# Patient Record
Sex: Male | Born: 1970 | Hispanic: Yes | Marital: Single
Health system: Southern US, Community
[De-identification: ages and names within clinical notes are randomized; demographics above are authoritative.]

## PROBLEM LIST (undated history)

## (undated) ENCOUNTER — Telehealth

## (undated) ENCOUNTER — Encounter

## (undated) ENCOUNTER — Ambulatory Visit

## (undated) ENCOUNTER — Encounter: Attending: Nurse Practitioner | Primary: Nurse Practitioner

## (undated) ENCOUNTER — Telehealth: Attending: Internal Medicine | Primary: Internal Medicine

## (undated) ENCOUNTER — Telehealth: Attending: Registered" | Primary: Registered"

## (undated) ENCOUNTER — Telehealth: Attending: Nephrology | Primary: Nephrology

## (undated) ENCOUNTER — Ambulatory Visit: Attending: Surgical Critical Care | Primary: Surgical Critical Care

## (undated) ENCOUNTER — Ambulatory Visit: Attending: Pharmacist | Primary: Pharmacist

## (undated) ENCOUNTER — Telehealth
Attending: Pharmacist Clinician (PhC)/ Clinical Pharmacy Specialist | Primary: Pharmacist Clinician (PhC)/ Clinical Pharmacy Specialist

## (undated) DIAGNOSIS — K4090 Unilateral inguinal hernia, without obstruction or gangrene, not specified as recurrent: Secondary | ICD-10-CM

## (undated) DIAGNOSIS — E119 Type 2 diabetes mellitus without complications: Secondary | ICD-10-CM

## (undated) DIAGNOSIS — B20 Human immunodeficiency virus [HIV] disease: Secondary | ICD-10-CM

## (undated) DIAGNOSIS — N200 Calculus of kidney: Secondary | ICD-10-CM

## (undated) HISTORY — PX: APPENDECTOMY: SHX54

## (undated) HISTORY — PX: OTHER SURGICAL HISTORY: SHX169

## (undated) MED ORDER — SULFAMETHOXAZOLE 800 MG-TRIMETHOPRIM 160 MG TABLET: Freq: Two times a day (BID) | ORAL | 0 days

---
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
## 2003-07-30 ENCOUNTER — Other Ambulatory Visit: Payer: Self-pay

## 2007-02-16 ENCOUNTER — Emergency Department: Payer: Self-pay | Admitting: Emergency Medicine

## 2007-02-27 ENCOUNTER — Emergency Department: Payer: Self-pay | Admitting: Emergency Medicine

## 2007-03-02 ENCOUNTER — Other Ambulatory Visit: Payer: Self-pay

## 2007-03-02 ENCOUNTER — Emergency Department: Payer: Self-pay | Admitting: Emergency Medicine

## 2007-12-25 ENCOUNTER — Other Ambulatory Visit: Payer: Self-pay

## 2007-12-25 ENCOUNTER — Emergency Department: Payer: Self-pay | Admitting: Internal Medicine

## 2009-01-31 ENCOUNTER — Inpatient Hospital Stay: Payer: Self-pay | Admitting: Internal Medicine

## 2009-10-25 ENCOUNTER — Emergency Department: Payer: Self-pay | Admitting: Emergency Medicine

## 2012-04-11 ENCOUNTER — Inpatient Hospital Stay: Payer: Self-pay | Admitting: Internal Medicine

## 2012-04-11 LAB — DIFFERENTIAL
Basophil %: 0.8 %
Eosinophil %: 11 %
Lymphocyte #: 1.5 10*3/uL (ref 1.0–3.6)
Monocyte #: 0.7 x10 3/mm (ref 0.2–1.0)
Monocyte %: 7.8 %

## 2012-04-11 LAB — URINALYSIS, COMPLETE
Bilirubin,UR: NEGATIVE
Blood: NEGATIVE
Glucose,UR: NEGATIVE mg/dL (ref 0–75)
Ketone: NEGATIVE
Nitrite: NEGATIVE
Protein: NEGATIVE
Specific Gravity: 1.016 (ref 1.003–1.030)
Squamous Epithelial: NONE SEEN
WBC UR: 16 /HPF (ref 0–5)

## 2012-04-11 LAB — COMPREHENSIVE METABOLIC PANEL
Alkaline Phosphatase: 184 U/L — ABNORMAL HIGH (ref 50–136)
BUN: 15 mg/dL (ref 7–18)
Bilirubin,Total: 0.5 mg/dL (ref 0.2–1.0)
Calcium, Total: 8.4 mg/dL — ABNORMAL LOW (ref 8.5–10.1)
Chloride: 103 mmol/L (ref 98–107)
EGFR (African American): >60
EGFR (Non-African Amer.): >60
Osmolality: 272 (ref 275–301)
Potassium: 3.6 mmol/L (ref 3.5–5.1)
SGOT(AST): 18 U/L (ref 15–37)
SGPT (ALT): 26 U/L (ref 12–78)
Sodium: 134 mmol/L — ABNORMAL LOW (ref 136–145)
Total Protein: 7.7 g/dL (ref 6.4–8.2)

## 2012-04-11 LAB — RAPID INFLUENZA A&B ANTIGENS

## 2012-04-11 LAB — CK-MB: CK-MB: 1.4 ng/mL (ref 0.5–3.6)

## 2012-04-11 LAB — CBC
HGB: 15.2 g/dL (ref 13.0–18.0)
MCV: 92 fL (ref 80–100)
Platelet: 197 10*3/uL (ref 150–440)
RBC: 4.65 10*6/uL (ref 4.40–5.90)
RDW: 12.9 % (ref 11.5–14.5)

## 2012-04-11 LAB — DRUG SCREEN, URINE
Amphetamines, Ur Screen: NEGATIVE (ref ?–1000)
Benzodiazepine, Ur Scrn: NEGATIVE (ref ?–200)
Methadone, Ur Screen: NEGATIVE (ref ?–300)
Opiate, Ur Screen: NEGATIVE (ref ?–300)

## 2012-04-11 LAB — TROPONIN I
Troponin-I: <0.02 ng/mL
Troponin-I: <0.02 ng/mL

## 2012-04-11 LAB — LIPASE, BLOOD: Lipase: 143 U/L (ref 73–393)

## 2012-04-12 LAB — BASIC METABOLIC PANEL
Anion Gap: 11 (ref 7–16)
BUN: 17 mg/dL (ref 7–18)
Calcium, Total: 8.4 mg/dL — ABNORMAL LOW (ref 8.5–10.1)
Chloride: 102 mmol/L (ref 98–107)
Co2: 20 mmol/L — ABNORMAL LOW (ref 21–32)
EGFR (African American): >60
EGFR (Non-African Amer.): >60
Glucose: 204 mg/dL — ABNORMAL HIGH (ref 65–99)
Osmolality: 274 (ref 275–301)
Potassium: 4.3 mmol/L (ref 3.5–5.1)

## 2012-04-12 LAB — CBC WITH DIFFERENTIAL/PLATELET
Basophil #: 0 10*3/uL (ref 0.0–0.1)
Eosinophil #: 0 10*3/uL (ref 0.0–0.7)
HCT: 40.8 % (ref 40.0–52.0)
HGB: 14.5 g/dL (ref 13.0–18.0)
Lymphocyte #: 0.9 10*3/uL — ABNORMAL LOW (ref 1.0–3.6)
MCH: 33 pg (ref 26.0–34.0)
Monocyte %: 2.3 %
Neutrophil #: 6.7 10*3/uL — ABNORMAL HIGH (ref 1.4–6.5)
Neutrophil %: 86.2 %
Platelet: 198 10*3/uL (ref 150–440)
RDW: 12.9 % (ref 11.5–14.5)

## 2012-04-16 LAB — CULTURE, BLOOD (SINGLE)

## 2014-07-12 NOTE — H&P (Signed)
PATIENT NAME:  [name], [name] MR#:  [id number] DATE OF BIRTH:  1970-05-12  DATE OF ADMISSION:  04/11/2012  PRIMARY INFECTIOUS DISEASE DOCTOR: Acey Lav, MD at [facility].   CHIEF COMPLAINT: Cough, shortness of breath, fever with left-sided chest pain.   HISTORY OF PRESENT ILLNESS: A 44 year old Spanish-speaking male patient with history of HIV and hyperlipidemia presents to the Emergency Room complaining of left-sided chest pain. This is exacerbated on taking deep breath, coughing or moving the left arm. He had also been having cough and some shortness of breath and fever over the past few days. The patient was found to have some pneumonitis with unknown CD4 count, hypoxia, needing 2 liters of oxygen with acute respiratory failure and was admitted to the hospitalist service for further workup and treatment. The patient was on Bactrim last year and is not on it anymore. He does not remember his last CD4 count. His influenza test is negative. He has a dry cough. He is not on home oxygen.   PAST MEDICAL HISTORY: HIV and hyperlipidemia.   FAMILY HISTORY: No significant family history on review.   ALLERGIES: MORPHINE AND SHELLFISH.   SOCIAL HISTORY: The patient does not smoke. No alcohol. No illicit drugs.   CODE STATUS: Full code.   REVIEW OF SYSTEMS:  CONSTITUTIONAL: Complains of fever and fatigue. No weight loss or weight gain.  EYES: No blurred vision, pain or tenderness.  ENT: No tinnitus, ear pain or hearing loss.  RESPIRATORY: Has wheezing and dry cough.  CARDIOVASCULAR: Has left-sided chest pain along with left arm pain.  GASTROINTESTINAL: No nausea, vomiting, diarrhea or abdominal pain.  GENITOURINARY: No dysuria or hematuria.  ENDOCRINE: No polyuria, nocturia or thyroid problems.  SKIN: No acne, rash or lesions.  MUSCULOSKELETAL: Has some back pain.  NEUROLOGIC: No numbness, weakness or dysarthria.  PSYCHIATRIC: No anxiety or depression.   HOME MEDICATIONS: Include:  1.   Norvir 100 mg oral 2 times a day.  2.  Atripla 300 mg oral 2 times a day.  3.  Truvada 300 mg oral once a day.   PHYSICAL EXAMINATION:  VITAL SIGNS: Pulse 90, respirations 20, blood pressure 110/67, saturating 96% on 2 L oxygen.  GENERAL: Obese male patient lying in bed in mild respiratory distress.  PSYCHIATRIC: Alert and oriented x 3. Mood and affect appropriate. Judgment intact.  HEENT: Atraumatic, normocephalic. Oral mucosa moist and pink. External ears and nose normal. No pallor. No icterus. Pupils bilaterally equal and react to light.  NECK: Supple. No thyromegaly. No palpable lymph nodes. Trachea midline. No carotid bruit or JVD.  CARDIOVASCULAR: S1, S2. Left-sided tender chest pain. Also pain on moving the left arm.  RESPIRATORY: Bilateral wheezing and crackles. Normal percussion GASTROINTESTINAL: Soft abdomen, nontender. Bowel sounds present. No visceromegaly palpable.  SKIN: Warm and dry. No petechiae, rash, ulcers, but diaphoresis.  MUSCULOSKELETAL: No joint swelling, redness, effusion of the large joints. Normal muscle tone.  NEUROLOGICAL: Motor strength 5 over 5 in upper and lower extremities. Sensation to fine touch intact all over.   LABORATORIES: Glucose 147, BUN 15, creatinine 1.13, sodium 134, potassium 3.6, albumin 3.8. AST, ALT, normal. Troponin less than 0.02. Urine drug screen negative. WBC 8.4, hemoglobin 15.2, differential not available at this time. Influenza test A and B negative. Urinalysis shows no bacteria.   Chest x-ray shows mild congestive heart failure that cannot be excluded and mild pneumonitis.   CT chest for PE with contrast showed no pulmonary embolism. basilar atelectasis versus infiltrates found.  ASSESSMENT AND PLAN:  1.  Acute respiratory failure secondary to possible acute bronchitis, but infiltrate seen on chest x-ray and CT scan. CD4 count unknown and the patient was on Bactrim in the past. We will start him on Levaquin along with Bactrim and  intravenous steroids to cover for PCP pneumonia and community acquired pneumonia. Will wait for the CD4 count and likely discontinue the Bactrim. Oxygen to be continued and nebulizers p.r.n. We will reassess in the morning and if the patient can be off his oxygen, he will likely be discharged home to follow up with his doctor.  2.  Human immunodeficiency virus. Continue human immunodeficiency virus medications. Await CD4 count.  3.  Deep vein thrombosis prophylaxis with Lovenox.  4.  CODE STATUS: Full code.   TIME SPENT: Greater than 75 minutes including speaking with the patient through an interpreter and reviewing records.   ____________________________ Molinda BailiffSrikar R. Wille Aubuchon, MD srs:aw D: 04/11/2012 13:13:12 ET T: 04/11/2012 13:30:27 ET JOB#: [id number]  cc: Wardell HeathSrikar R. Melika Reder, MD, <Dictator> Orie FishermanSRIKAR R Sharlene Mccluskey MD ELECTRONICALLY SIGNED 04/27/2012 13:22

## 2014-07-12 NOTE — Discharge Summary (Signed)
PATIENT NAME:  [name], [name] MR#:  [id number] DATE OF BIRTH:  11/26/1970  DATE OF ADMISSION:  04/11/2012 DATE OF DISCHARGE:  04/12/2012  PRIMARY CARE PHYSICIAN: [facility]   DISCHARGE DIAGNOSES: 1. Acute bronchitis.  2. Acute respiratory failure.  3. Human immunodeficiency virus.   IMAGING STUDIES:  A chest x-ray showed mild pneumonitis.  CT scan of the chest for PE showed mild basilar pneumonitis versus atelectasis. No PE found.   ADMITTING HISTORY AND PHYSICAL: Please see admission H and P dictated on 04/11/2012. In brief, the patient is a 44 year old Spanish-speaking male with history of HIV and hyperlipidemia, presented to the Emergency Room complaining of left-sided chest pain and multiple days of cough and productive cough and sputum. The patient's chest x-ray showed pneumonitis, and he was admitted to the hospitalist service with acute respiratory failure, needing oxygen support. He was found to be wheezing.   HOSPITAL COURSE: Acute bronchitis: The patient did not have any fever or elevated white count in the hospital. He was on around-the-clock nebulizers, IV steroids and 2 liters oxygen for acute respiratory failure. He improved well on Levaquin. The patient was also placed on Bactrim, with unknown CD4 count, for possible PCP pneumonia; but he improved well with treatment and was discharged home with community-acquired pneumonitis/acute bronchitis on levofloxacin. The patient is on preventative Bactrim doses as an outpatient which will be continued. His CD4 count is pending at this time. [name], and he has an appointment in a week.   DISCHARGE VITAL SIGNS:  At the time of discharge, the patient's temperature was 97.5, blood pressure 121/67, saturating 96% on room air, and without any wheezing on examination and comfortable with breathing.   DISCHARGE MEDICATIONS: 1. Levaquin 750 mg oral once a day for 5 days.  2. Bactrim  800/160, 1 tablet oral once a day.  3. Truvada 200/300 oral once a day.  4. Norvir 100 mg oral 2 times a day.  5. Prezista 600 mg oral 2 times a day.  6. Percocet 5/325, 1 tablet oral 3 times a day.  7. Albuterol 2 puffs inhaled 4 times a day as needed.  8. Prednisone 60 mg tapered over 6 days.   DISCHARGE INSTRUCTIONS:  1. Follow up with primary care physician in a week.  2. Low fat, low cholesterol diet.  3. Activity as tolerated.  4. The patient has been advised to call his doctor or return to the Emergency Room if there is any worsening of symptoms.    TIME SPENT: Time spent on the day of discharge in discharge activity was 40 minutes. ____________________________ Molinda BailiffSrikar R. Mallori Araque, MD srs:cb D: 04/12/2012 14:57:22 ET T: 04/12/2012 17:04:00 ET JOB#: [id number]  cc: Wardell HeathSrikar R. Araiya Tilmon, MD, <Dictator> [facility] Orie FishermanSRIKAR R Kayman Snuffer MD ELECTRONICALLY SIGNED 04/27/2012 13:22

## 2016-11-16 ENCOUNTER — Ambulatory Visit: Admission: RE | Admit: 2016-11-16 | Discharge: 2016-11-16 | Disposition: A | Discharge status: Home / Self Care

## 2016-11-16 DIAGNOSIS — E119 Type 2 diabetes mellitus without complications: Secondary | ICD-10-CM

## 2016-11-16 DIAGNOSIS — B20 Human immunodeficiency virus [HIV] disease: Principal | ICD-10-CM

## 2016-11-16 MED ORDER — LISINOPRIL 5 MG TABLET
ORAL_TABLET | Freq: Every day | ORAL | 2 refills | 0 days | Status: CP
Start: 2016-11-16 — End: 2017-02-08

## 2016-11-16 MED ORDER — DARUNAVIR ETHANOLATE 600 MG TABLET
ORAL_TABLET | Freq: Two times a day (BID) | ORAL | 2 refills | 0.00000 days | Status: CP
Start: 2016-11-16 — End: 2017-02-08

## 2016-11-16 MED ORDER — EMTRICITABINE 200 MG-TENOFOVIR ALAFENAMIDE FUMARATE 25 MG TABLET
ORAL_TABLET | Freq: Every day | ORAL | 2 refills | 0 days | Status: CP
Start: 2016-11-16 — End: 2017-02-08

## 2016-11-16 MED ORDER — RITONAVIR 100 MG TABLET
ORAL_TABLET | Freq: Two times a day (BID) | ORAL | 2 refills | 0.00000 days | Status: CP
Start: 2016-11-16 — End: 2017-02-08

## 2017-01-12 MED ORDER — GLIPIZIDE 5 MG TABLET
ORAL_TABLET | Freq: Two times a day (BID) | ORAL | 3 refills | 0 days | Status: CP
Start: 2017-01-12 — End: 2018-01-04

## 2017-02-08 MED ORDER — LISINOPRIL 5 MG TABLET
ORAL_TABLET | Freq: Every day | ORAL | 2 refills | 0 days | Status: CP
Start: 2017-02-08 — End: 2017-02-17

## 2017-02-08 MED ORDER — DARUNAVIR ETHANOLATE 600 MG TABLET
ORAL_TABLET | Freq: Two times a day (BID) | ORAL | 2 refills | 0.00000 days | Status: CP
Start: 2017-02-08 — End: 2017-02-17

## 2017-02-08 MED ORDER — RITONAVIR 100 MG TABLET
ORAL_TABLET | Freq: Two times a day (BID) | ORAL | 2 refills | 0.00000 days | Status: CP
Start: 2017-02-08 — End: 2017-06-15

## 2017-02-08 MED ORDER — EMTRICITABINE 200 MG-TENOFOVIR ALAFENAMIDE FUMARATE 25 MG TABLET
ORAL_TABLET | Freq: Every day | ORAL | 2 refills | 0 days | Status: CP
Start: 2017-02-08 — End: 2017-02-17

## 2017-02-20 MED ORDER — LISINOPRIL 5 MG TABLET
ORAL_TABLET | Freq: Every day | ORAL | 2 refills | 0.00000 days | Status: CP
Start: 2017-02-20 — End: 2017-08-17

## 2017-02-20 MED ORDER — EMTRICITABINE 200 MG-TENOFOVIR ALAFENAMIDE FUMARATE 25 MG TABLET
ORAL_TABLET | Freq: Every day | ORAL | 2 refills | 0 days | Status: CP
Start: 2017-02-20 — End: 2017-08-17

## 2017-02-20 MED ORDER — DARUNAVIR ETHANOLATE 600 MG TABLET
ORAL_TABLET | Freq: Two times a day (BID) | ORAL | 2 refills | 0.00000 days | Status: CP
Start: 2017-02-20 — End: 2017-08-17

## 2017-06-17 MED ORDER — NORVIR 100 MG TABLET
ORAL_TABLET | 0 refills | 0 days | Status: CP
Start: 2017-06-17 — End: 2017-07-13

## 2017-07-14 MED ORDER — NORVIR 100 MG TABLET
ORAL_TABLET | 0 refills | 0 days | Status: CP
Start: 2017-07-14 — End: 2017-08-17

## 2017-07-14 MED ORDER — METFORMIN 850 MG TABLET
ORAL_TABLET | 0 refills | 0 days | Status: CP
Start: 2017-07-14 — End: 2017-10-14

## 2017-08-17 MED ORDER — PREZISTA 600 MG TABLET
ORAL_TABLET | 0 refills | 0 days | Status: CP
Start: 2017-08-17 — End: 2017-09-19

## 2017-08-17 MED ORDER — DESCOVY 200 MG-25 MG TABLET
ORAL_TABLET | 0 refills | 0 days | Status: CP
Start: 2017-08-17 — End: 2017-09-19

## 2017-08-17 MED ORDER — LISINOPRIL 5 MG TABLET
ORAL_TABLET | 0 refills | 0 days | Status: CP
Start: 2017-08-17 — End: 2017-09-19

## 2017-08-17 MED ORDER — RITONAVIR 100 MG TABLET
ORAL_TABLET | 0 refills | 0 days | Status: CP
Start: 2017-08-17 — End: 2017-09-19

## 2017-09-19 MED ORDER — RITONAVIR 100 MG TABLET
ORAL_TABLET | Freq: Two times a day (BID) | ORAL | 2 refills | 0.00000 days | Status: CP
Start: 2017-09-19 — End: 2018-01-04

## 2017-09-19 MED ORDER — EMTRICITABINE 200 MG-TENOFOVIR ALAFENAMIDE FUMARATE 25 MG TABLET
ORAL_TABLET | Freq: Every day | ORAL | 2 refills | 0.00000 days | Status: CP
Start: 2017-09-19 — End: 2017-12-09

## 2017-09-19 MED ORDER — DARUNAVIR ETHANOLATE 600 MG TABLET
ORAL_TABLET | Freq: Two times a day (BID) | ORAL | 2 refills | 0.00000 days | Status: CP
Start: 2017-09-19 — End: 2017-12-09

## 2017-09-19 MED ORDER — LISINOPRIL 5 MG TABLET
ORAL_TABLET | Freq: Every day | ORAL | 2 refills | 0 days | Status: CP
Start: 2017-09-19 — End: 2017-12-09

## 2017-10-17 MED ORDER — METFORMIN 850 MG TABLET
ORAL_TABLET | 0 refills | 0 days | Status: CP
Start: 2017-10-17 — End: 2018-01-04

## 2017-12-12 MED ORDER — LISINOPRIL 5 MG TABLET
ORAL_TABLET | 0 refills | 0 days | Status: CP
Start: 2017-12-12 — End: 2018-01-05

## 2017-12-12 MED ORDER — PREZISTA 600 MG TABLET
ORAL_TABLET | 0 refills | 0 days | Status: CP
Start: 2017-12-12 — End: 2018-01-05

## 2017-12-12 MED ORDER — DESCOVY 200 MG-25 MG TABLET
ORAL_TABLET | 0 refills | 0 days | Status: CP
Start: 2017-12-12 — End: 2018-01-05

## 2018-01-05 MED ORDER — LISINOPRIL 5 MG TABLET
ORAL_TABLET | 3 refills | 0 days | Status: CP
Start: 2018-01-05 — End: 2018-05-09

## 2018-01-05 MED ORDER — METFORMIN 850 MG TABLET
ORAL_TABLET | 3 refills | 0 days | Status: CP
Start: 2018-01-05 — End: ?

## 2018-01-05 MED ORDER — DESCOVY 200 MG-25 MG TABLET
ORAL_TABLET | 3 refills | 0 days | Status: CP
Start: 2018-01-05 — End: 2018-05-09

## 2018-01-05 MED ORDER — PREZISTA 600 MG TABLET
ORAL_TABLET | 3 refills | 0 days | Status: CP
Start: 2018-01-05 — End: 2018-05-09

## 2018-01-05 MED ORDER — GLIPIZIDE 5 MG TABLET
ORAL_TABLET | 3 refills | 0 days | Status: CP
Start: 2018-01-05 — End: ?

## 2018-01-05 MED ORDER — NORVIR 100 MG TABLET
ORAL_TABLET | 3 refills | 0 days | Status: CP
Start: 2018-01-05 — End: 2018-05-09

## 2018-05-10 MED ORDER — LISINOPRIL 5 MG TABLET
ORAL_TABLET | 0 refills | 0 days | Status: CP
Start: 2018-05-10 — End: 2018-06-08

## 2018-05-10 MED ORDER — NORVIR 100 MG TABLET
ORAL_TABLET | 0 refills | 0 days | Status: CP
Start: 2018-05-10 — End: 2018-06-08

## 2018-05-10 MED ORDER — PREZISTA 600 MG TABLET
ORAL_TABLET | 0 refills | 0 days | Status: CP
Start: 2018-05-10 — End: 2018-06-08

## 2018-05-10 MED ORDER — DESCOVY 200 MG-25 MG TABLET
ORAL_TABLET | 0 refills | 0 days | Status: CP
Start: 2018-05-10 — End: 2018-06-08

## 2018-06-08 DIAGNOSIS — B20 Human immunodeficiency virus [HIV] disease: Principal | ICD-10-CM

## 2018-06-09 MED ORDER — DARUNAVIR ETHANOLATE 600 MG TABLET
ORAL_TABLET | Freq: Two times a day (BID) | ORAL | 0 refills | 0 days | Status: CP
Start: 2018-06-09 — End: 2018-07-24

## 2018-06-09 MED ORDER — LISINOPRIL 5 MG TABLET
ORAL_TABLET | Freq: Every day | ORAL | 0 refills | 0 days | Status: CP
Start: 2018-06-09 — End: 2018-07-18

## 2018-06-09 MED ORDER — RITONAVIR 100 MG TABLET
ORAL_TABLET | Freq: Two times a day (BID) | ORAL | 0 refills | 0 days | Status: CP
Start: 2018-06-09 — End: 2018-07-24

## 2018-06-09 MED ORDER — DESCOVY 200 MG-25 MG TABLET
ORAL_TABLET | Freq: Every day | ORAL | 0 refills | 0.00000 days | Status: CP
Start: 2018-06-09 — End: 2018-07-24

## 2018-07-18 MED ORDER — LISINOPRIL 5 MG TABLET
ORAL_TABLET | Freq: Every day | ORAL | 0 refills | 0.00000 days | Status: CP
Start: 2018-07-18 — End: ?

## 2018-07-24 MED ORDER — RITONAVIR 100 MG TABLET
ORAL_TABLET | Freq: Two times a day (BID) | ORAL | 2 refills | 0 days | Status: CP
Start: 2018-07-24 — End: 2018-10-23

## 2018-07-24 MED ORDER — DESCOVY 200 MG-25 MG TABLET
ORAL_TABLET | Freq: Every day | ORAL | 2 refills | 0.00000 days | Status: CP
Start: 2018-07-24 — End: 2018-10-23

## 2018-07-24 MED ORDER — DARUNAVIR ETHANOLATE 600 MG TABLET
ORAL_TABLET | Freq: Two times a day (BID) | ORAL | 2 refills | 0.00000 days | Status: CP
Start: 2018-07-24 — End: 2018-10-23

## 2018-09-26 ENCOUNTER — Ambulatory Visit: Admit: 2018-09-26 | Discharge: 2018-09-26

## 2018-09-26 DIAGNOSIS — B20 Human immunodeficiency virus [HIV] disease: Principal | ICD-10-CM

## 2018-09-26 DIAGNOSIS — E119 Type 2 diabetes mellitus without complications: Secondary | ICD-10-CM

## 2018-09-26 MED ORDER — ATORVASTATIN 20 MG TABLET
ORAL_TABLET | Freq: Every day | ORAL | 11 refills | 0 days | Status: CP
Start: 2018-09-26 — End: 2018-10-26

## 2018-10-23 MED ORDER — NORVIR 100 MG TABLET
ORAL_TABLET | 2 refills | 0 days | Status: CP
Start: 2018-10-23 — End: ?

## 2018-10-23 MED ORDER — PREZISTA 600 MG TABLET
ORAL_TABLET | 2 refills | 0 days | Status: CP
Start: 2018-10-23 — End: ?

## 2018-10-23 MED ORDER — DESCOVY 200 MG-25 MG TABLET
ORAL_TABLET | Freq: Every day | ORAL | 2 refills | 30.00000 days | Status: CP
Start: 2018-10-23 — End: ?

## 2020-10-01 ENCOUNTER — Emergency Department: Payer: Self-pay

## 2020-10-01 ENCOUNTER — Inpatient Hospital Stay: Payer: Self-pay | Admitting: Anesthesiology

## 2020-10-01 ENCOUNTER — Other Ambulatory Visit: Payer: Self-pay

## 2020-10-01 ENCOUNTER — Encounter
Admission: EM | Disposition: A | Discharge status: Home Health | Payer: Self-pay | Source: Home / Self Care | Attending: Internal Medicine

## 2020-10-01 ENCOUNTER — Inpatient Hospital Stay
Admission: EM | Admit: 2020-10-01 | Discharge: 2020-10-03 | DRG: 975 | Disposition: A | Discharge status: Home Health | Payer: Self-pay | Attending: Internal Medicine | Admitting: Internal Medicine

## 2020-10-01 ENCOUNTER — Encounter: Payer: Self-pay | Admitting: *Deleted

## 2020-10-01 DIAGNOSIS — Z6841 Body Mass Index (BMI) 40.0 and over, adult: Secondary | ICD-10-CM

## 2020-10-01 DIAGNOSIS — L0291 Cutaneous abscess, unspecified: Secondary | ICD-10-CM

## 2020-10-01 DIAGNOSIS — D6959 Other secondary thrombocytopenia: Secondary | ICD-10-CM | POA: Diagnosis present

## 2020-10-01 DIAGNOSIS — N492 Inflammatory disorders of scrotum: Secondary | ICD-10-CM | POA: Diagnosis present

## 2020-10-01 DIAGNOSIS — B372 Candidiasis of skin and nail: Secondary | ICD-10-CM | POA: Diagnosis present

## 2020-10-01 DIAGNOSIS — Z833 Family history of diabetes mellitus: Secondary | ICD-10-CM

## 2020-10-01 DIAGNOSIS — E1165 Type 2 diabetes mellitus with hyperglycemia: Secondary | ICD-10-CM | POA: Diagnosis present

## 2020-10-01 DIAGNOSIS — L02214 Cutaneous abscess of groin: Secondary | ICD-10-CM | POA: Diagnosis present

## 2020-10-01 DIAGNOSIS — D696 Thrombocytopenia, unspecified: Secondary | ICD-10-CM

## 2020-10-01 DIAGNOSIS — N132 Hydronephrosis with renal and ureteral calculous obstruction: Secondary | ICD-10-CM | POA: Diagnosis present

## 2020-10-01 DIAGNOSIS — A411 Sepsis due to other specified staphylococcus: Principal | ICD-10-CM | POA: Diagnosis present

## 2020-10-01 DIAGNOSIS — Z21 Asymptomatic human immunodeficiency virus [HIV] infection status: Secondary | ICD-10-CM | POA: Diagnosis present

## 2020-10-01 DIAGNOSIS — N5089 Other specified disorders of the male genital organs: Secondary | ICD-10-CM | POA: Diagnosis present

## 2020-10-01 DIAGNOSIS — B20 Human immunodeficiency virus [HIV] disease: Secondary | ICD-10-CM | POA: Diagnosis present

## 2020-10-01 DIAGNOSIS — D6489 Other specified anemias: Secondary | ICD-10-CM | POA: Diagnosis present

## 2020-10-01 DIAGNOSIS — B9561 Methicillin susceptible Staphylococcus aureus infection as the cause of diseases classified elsewhere: Secondary | ICD-10-CM

## 2020-10-01 DIAGNOSIS — E871 Hypo-osmolality and hyponatremia: Secondary | ICD-10-CM | POA: Diagnosis present

## 2020-10-01 DIAGNOSIS — L041 Acute lymphadenitis of trunk: Secondary | ICD-10-CM

## 2020-10-01 DIAGNOSIS — L0390 Cellulitis, unspecified: Secondary | ICD-10-CM

## 2020-10-01 DIAGNOSIS — A419 Sepsis, unspecified organism: Secondary | ICD-10-CM | POA: Diagnosis present

## 2020-10-01 DIAGNOSIS — N1330 Unspecified hydronephrosis: Secondary | ICD-10-CM

## 2020-10-01 DIAGNOSIS — E118 Type 2 diabetes mellitus with unspecified complications: Secondary | ICD-10-CM

## 2020-10-01 DIAGNOSIS — K4090 Unilateral inguinal hernia, without obstruction or gangrene, not specified as recurrent: Secondary | ICD-10-CM | POA: Diagnosis present

## 2020-10-01 DIAGNOSIS — E119 Type 2 diabetes mellitus without complications: Secondary | ICD-10-CM

## 2020-10-01 DIAGNOSIS — Z20822 Contact with and (suspected) exposure to covid-19: Secondary | ICD-10-CM | POA: Diagnosis present

## 2020-10-01 HISTORY — DX: Calculus of kidney: N20.0

## 2020-10-01 HISTORY — PX: INCISION AND DRAINAGE ABSCESS: SHX5864

## 2020-10-01 HISTORY — DX: Type 2 diabetes mellitus without complications: E11.9

## 2020-10-01 HISTORY — DX: Cutaneous abscess of groin: L02.214

## 2020-10-01 HISTORY — DX: Unilateral inguinal hernia, without obstruction or gangrene, not specified as recurrent: K40.90

## 2020-10-01 HISTORY — DX: Human immunodeficiency virus (HIV) disease: B20

## 2020-10-01 LAB — PROTIME-INR
INR: 1 (ref 0.8–1.2)
Prothrombin Time: 13.1 seconds (ref 11.4–15.2)

## 2020-10-01 LAB — TYPE AND SCREEN
ABO/RH(D): O POS
Antibody Screen: NEGATIVE

## 2020-10-01 LAB — HIV ANTIBODY (ROUTINE TESTING W REFLEX): HIV Screen 4th Generation wRfx: REACTIVE — AB

## 2020-10-01 LAB — URINALYSIS, COMPLETE (UACMP) WITH MICROSCOPIC
Bacteria, UA: NONE SEEN
Bilirubin Urine: NEGATIVE
Glucose, UA: >=500 mg/dL — AB
Ketones, ur: 5 mg/dL — AB
Nitrite: NEGATIVE
Protein, ur: 30 mg/dL — AB
Specific Gravity, Urine: 1.017 (ref 1.005–1.030)
pH: 7 (ref 5.0–8.0)

## 2020-10-01 LAB — COMPREHENSIVE METABOLIC PANEL
ALT: 14 U/L (ref 0–44)
AST: 17 U/L (ref 15–41)
Albumin: 3 g/dL — ABNORMAL LOW (ref 3.5–5.0)
Alkaline Phosphatase: 127 U/L — ABNORMAL HIGH (ref 38–126)
Anion gap: 11 (ref 5–15)
BUN: 21 mg/dL — ABNORMAL HIGH (ref 6–20)
CO2: 20 mmol/L — ABNORMAL LOW (ref 22–32)
Calcium: 8.2 mg/dL — ABNORMAL LOW (ref 8.9–10.3)
Chloride: 96 mmol/L — ABNORMAL LOW (ref 98–111)
Creatinine, Ser: 0.98 mg/dL (ref 0.61–1.24)
GFR, Estimated: >60 mL/min (ref >60)
Glucose, Bld: 407 mg/dL — ABNORMAL HIGH (ref 70–99)
Potassium: 3.6 mmol/L (ref 3.5–5.1)
Sodium: 127 mmol/L — ABNORMAL LOW (ref 135–145)
Total Bilirubin: 1.2 mg/dL (ref 0.3–1.2)
Total Protein: 7.5 g/dL (ref 6.5–8.1)

## 2020-10-01 LAB — LACTATE DEHYDROGENASE: LDH: 149 U/L (ref 98–192)

## 2020-10-01 LAB — CBC WITH DIFFERENTIAL/PLATELET
Abs Immature Granulocytes: 0.03 10*3/uL (ref 0.00–0.07)
Basophils Absolute: 0 10*3/uL (ref 0.0–0.1)
Basophils Relative: 0 %
Eosinophils Absolute: 0.1 10*3/uL (ref 0.0–0.5)
Eosinophils Relative: 1 %
HCT: 34.7 % — ABNORMAL LOW (ref 39.0–52.0)
Hemoglobin: 12.7 g/dL — ABNORMAL LOW (ref 13.0–17.0)
Immature Granulocytes: 0 %
Lymphocytes Relative: 9 %
Lymphs Abs: 0.8 10*3/uL (ref 0.7–4.0)
MCH: 29.5 pg (ref 26.0–34.0)
MCHC: 36.6 g/dL — ABNORMAL HIGH (ref 30.0–36.0)
MCV: 80.5 fL (ref 80.0–100.0)
Monocytes Absolute: 1 10*3/uL (ref 0.1–1.0)
Monocytes Relative: 11 %
Neutro Abs: 7.2 10*3/uL (ref 1.7–7.7)
Neutrophils Relative %: 79 %
Platelets: 109 10*3/uL — ABNORMAL LOW (ref 150–400)
RBC: 4.31 MIL/uL (ref 4.22–5.81)
RDW: 11.9 % (ref 11.5–15.5)
WBC: 9 10*3/uL (ref 4.0–10.5)
nRBC: 0 % (ref 0.0–0.2)

## 2020-10-01 LAB — C-REACTIVE PROTEIN: CRP: 16.6 mg/dL — ABNORMAL HIGH (ref <1.0)

## 2020-10-01 LAB — HEMOGLOBIN A1C
Hgb A1c MFr Bld: 12.1 % — ABNORMAL HIGH (ref 4.8–5.6)
Mean Plasma Glucose: 300.57 mg/dL

## 2020-10-01 LAB — LACTIC ACID, PLASMA
Lactic Acid, Venous: 1.2 mmol/L (ref 0.5–1.9)
Lactic Acid, Venous: 1.4 mmol/L (ref 0.5–1.9)

## 2020-10-01 LAB — RESP PANEL BY RT-PCR (FLU A&B, COVID) ARPGX2
Influenza A by PCR: NEGATIVE
Influenza B by PCR: NEGATIVE
SARS Coronavirus 2 by RT PCR: NEGATIVE

## 2020-10-01 LAB — CBG MONITORING, ED
Glucose-Capillary: 255 mg/dL — ABNORMAL HIGH (ref 70–99)
Glucose-Capillary: 223 mg/dL — ABNORMAL HIGH (ref 70–99)
Glucose-Capillary: 177 mg/dL — ABNORMAL HIGH (ref 70–99)

## 2020-10-01 LAB — APTT: aPTT: 35 seconds (ref 24–36)

## 2020-10-01 LAB — PATHOLOGIST SMEAR REVIEW

## 2020-10-01 LAB — GLUCOSE, CAPILLARY: Glucose-Capillary: 272 mg/dL — ABNORMAL HIGH (ref 70–99)

## 2020-10-01 LAB — SEDIMENTATION RATE: Sed Rate: 60 mm/hr — ABNORMAL HIGH (ref 0–20)

## 2020-10-01 LAB — SAVE SMEAR(SSMR), FOR PROVIDER SLIDE REVIEW

## 2020-10-01 LAB — PROCALCITONIN: Procalcitonin: 0.14 ng/mL

## 2020-10-01 LAB — BETA-HYDROXYBUTYRIC ACID: Beta-Hydroxybutyric Acid: 0.77 mmol/L — ABNORMAL HIGH (ref 0.05–0.27)

## 2020-10-01 SURGERY — INCISION AND DRAINAGE, ABSCESS
Anesthesia: General | Site: Groin | Laterality: Right

## 2020-10-01 MED ORDER — MIDAZOLAM HCL 2 MG/2ML IJ SOLN
INTRAMUSCULAR | Status: DC | PRN
  Administered 2020-10-01: 2 mg via INTRAVENOUS

## 2020-10-01 MED ORDER — FENTANYL CITRATE (PF) 100 MCG/2ML IJ SOLN
25.0000 ug | INTRAMUSCULAR | Status: DC | PRN
  Administered 2020-10-01: 50 ug via INTRAVENOUS

## 2020-10-01 MED ORDER — MIDAZOLAM HCL 2 MG/2ML IJ SOLN
INTRAMUSCULAR | Status: AC
  Filled 2020-10-01: qty 2

## 2020-10-01 MED ORDER — ONDANSETRON HCL 4 MG/2ML IJ SOLN
INTRAMUSCULAR | Status: DC | PRN
  Administered 2020-10-01: 4 mg via INTRAVENOUS

## 2020-10-01 MED ORDER — INSULIN ASPART 100 UNIT/ML IJ SOLN
0.0000 [IU] | Freq: Every day | INTRAMUSCULAR | Status: DC
  Administered 2020-10-01: 3 [IU] via SUBCUTANEOUS
  Filled 2020-10-01: qty 1

## 2020-10-01 MED ORDER — ONDANSETRON HCL 4 MG/2ML IJ SOLN: 4.0000 mg | Freq: Three times a day (TID) | INTRAMUSCULAR | Status: DC | PRN

## 2020-10-01 MED ORDER — ROCURONIUM BROMIDE 100 MG/10ML IV SOLN
INTRAVENOUS | Status: DC | PRN
  Administered 2020-10-01: 50 mg via INTRAVENOUS
  Administered 2020-10-01: 10 mg via INTRAVENOUS
  Administered 2020-10-01: 20 mg via INTRAVENOUS

## 2020-10-01 MED ORDER — KETOROLAC TROMETHAMINE 30 MG/ML IJ SOLN
INTRAMUSCULAR | Status: DC | PRN
  Administered 2020-10-01: 30 mg via INTRAVENOUS

## 2020-10-01 MED ORDER — OXYCODONE-ACETAMINOPHEN 5-325 MG PO TABS
1.0000 | ORAL_TABLET | ORAL | Status: DC | PRN
  Administered 2020-10-01 – 2020-10-03 (×7): 1 via ORAL
  Filled 2020-10-01 (×7): qty 1

## 2020-10-01 MED ORDER — DEXMEDETOMIDINE (PRECEDEX) IN NS 20 MCG/5ML (4 MCG/ML) IV SYRINGE
PREFILLED_SYRINGE | INTRAVENOUS | Status: DC | PRN
  Administered 2020-10-01: 4 ug via INTRAVENOUS

## 2020-10-01 MED ORDER — VANCOMYCIN HCL 1250 MG/250ML IV SOLN
1250.0000 mg | Freq: Once | INTRAVENOUS | Status: AC
  Administered 2020-10-01: 1250 mg via INTRAVENOUS
  Filled 2020-10-01: qty 250

## 2020-10-01 MED ORDER — HYDRALAZINE HCL 20 MG/ML IJ SOLN: 5.0000 mg | INTRAMUSCULAR | Status: DC | PRN

## 2020-10-01 MED ORDER — SUGAMMADEX SODIUM 200 MG/2ML IV SOLN
INTRAVENOUS | Status: DC | PRN
  Administered 2020-10-01: 200 mg via INTRAVENOUS

## 2020-10-01 MED ORDER — PIPERACILLIN-TAZOBACTAM 3.375 G IVPB
3.3750 g | Freq: Three times a day (TID) | INTRAVENOUS | Status: DC
  Administered 2020-10-01 – 2020-10-02 (×2): 3.375 g via INTRAVENOUS
  Filled 2020-10-01 (×2): qty 50

## 2020-10-01 MED ORDER — IOHEXOL 350 MG/ML SOLN
100.0000 mL | Freq: Once | INTRAVENOUS | Status: AC | PRN
  Administered 2020-10-01: 100 mL via INTRAVENOUS

## 2020-10-01 MED ORDER — INSULIN ASPART 100 UNIT/ML IJ SOLN
0.0000 [IU] | Freq: Three times a day (TID) | INTRAMUSCULAR | Status: DC
  Administered 2020-10-01 – 2020-10-02 (×4): 5 [IU] via SUBCUTANEOUS
  Administered 2020-10-03: 3 [IU] via SUBCUTANEOUS
  Administered 2020-10-03: 5 [IU] via SUBCUTANEOUS
  Filled 2020-10-01 (×6): qty 1

## 2020-10-01 MED ORDER — 0.9 % SODIUM CHLORIDE (POUR BTL) OPTIME
TOPICAL | Status: DC | PRN
  Administered 2020-10-01: 500 mL

## 2020-10-01 MED ORDER — MORPHINE SULFATE (PF) 4 MG/ML IV SOLN
4.0000 mg | Freq: Once | INTRAVENOUS | Status: AC
  Administered 2020-10-01: 4 mg via INTRAVENOUS
  Filled 2020-10-01: qty 1

## 2020-10-01 MED ORDER — LIDOCAINE HCL (CARDIAC) PF 100 MG/5ML IV SOSY
PREFILLED_SYRINGE | INTRAVENOUS | Status: DC | PRN
  Administered 2020-10-01: 100 mg via INTRAVENOUS

## 2020-10-01 MED ORDER — INSULIN GLARGINE 100 UNIT/ML ~~LOC~~ SOLN
5.0000 [IU] | Freq: Every day | SUBCUTANEOUS | Status: DC
  Administered 2020-10-01 – 2020-10-03 (×3): 5 [IU] via SUBCUTANEOUS
  Filled 2020-10-01 (×4): qty 0.05

## 2020-10-01 MED ORDER — PROPOFOL 10 MG/ML IV BOLUS
INTRAVENOUS | Status: AC
  Filled 2020-10-01: qty 20

## 2020-10-01 MED ORDER — SODIUM CHLORIDE 0.9 % IV SOLN: INTRAVENOUS | Status: DC

## 2020-10-01 MED ORDER — SODIUM CHLORIDE 0.9 % IV SOLN
2.0000 g | Freq: Once | INTRAVENOUS | Status: AC
  Administered 2020-10-01: 2 g via INTRAVENOUS
  Filled 2020-10-01: qty 2

## 2020-10-01 MED ORDER — FENTANYL CITRATE (PF) 100 MCG/2ML IJ SOLN
INTRAMUSCULAR | Status: AC
  Administered 2020-10-01: 50 ug via INTRAVENOUS
  Filled 2020-10-01: qty 2

## 2020-10-01 MED ORDER — FENTANYL CITRATE (PF) 100 MCG/2ML IJ SOLN
INTRAMUSCULAR | Status: DC | PRN
  Administered 2020-10-01: 100 ug via INTRAVENOUS

## 2020-10-01 MED ORDER — PROPOFOL 10 MG/ML IV BOLUS
INTRAVENOUS | Status: DC | PRN
  Administered 2020-10-01: 150 mg via INTRAVENOUS

## 2020-10-01 MED ORDER — ACETAMINOPHEN 10 MG/ML IV SOLN
INTRAVENOUS | Status: DC | PRN
  Administered 2020-10-01: 1000 mg via INTRAVENOUS

## 2020-10-01 MED ORDER — VANCOMYCIN HCL 1500 MG/300ML IV SOLN
1500.0000 mg | INTRAVENOUS | Status: DC
  Administered 2020-10-01: 1500 mg via INTRAVENOUS
  Filled 2020-10-01 (×2): qty 300

## 2020-10-01 MED ORDER — ACETAMINOPHEN 10 MG/ML IV SOLN
INTRAVENOUS | Status: AC
  Filled 2020-10-01: qty 100

## 2020-10-01 MED ORDER — ACETAMINOPHEN 325 MG PO TABS: 650.0000 mg | ORAL_TABLET | Freq: Four times a day (QID) | ORAL | Status: DC | PRN

## 2020-10-01 MED ORDER — CHLORHEXIDINE GLUCONATE 0.12 % MT SOLN
OROMUCOSAL | Status: AC
  Administered 2020-10-01: 15 mL via OROMUCOSAL
  Filled 2020-10-01: qty 15

## 2020-10-01 MED ORDER — CHLORHEXIDINE GLUCONATE 0.12 % MT SOLN: 15.0000 mL | Freq: Once | OROMUCOSAL | Status: AC

## 2020-10-01 MED ORDER — FENTANYL CITRATE (PF) 100 MCG/2ML IJ SOLN
INTRAMUSCULAR | Status: AC
  Filled 2020-10-01: qty 2

## 2020-10-01 MED ORDER — PHENYLEPHRINE HCL (PRESSORS) 10 MG/ML IV SOLN
INTRAVENOUS | Status: DC | PRN
  Administered 2020-10-01 (×4): 100 ug via INTRAVENOUS

## 2020-10-01 MED ORDER — ACETAMINOPHEN 10 MG/ML IV SOLN: 1000.0000 mg | Freq: Once | INTRAVENOUS | Status: DC | PRN

## 2020-10-01 MED ORDER — DEXMEDETOMIDINE (PRECEDEX) IN NS 20 MCG/5ML (4 MCG/ML) IV SYRINGE
PREFILLED_SYRINGE | INTRAVENOUS | Status: AC
  Filled 2020-10-01: qty 5

## 2020-10-01 MED ORDER — FENTANYL CITRATE (PF) 100 MCG/2ML IJ SOLN
25.0000 ug | INTRAMUSCULAR | Status: DC | PRN
  Administered 2020-10-01: 25 ug via INTRAVENOUS
  Filled 2020-10-01: qty 2

## 2020-10-01 MED ORDER — ONDANSETRON HCL 4 MG/2ML IJ SOLN: 4.0000 mg | Freq: Once | INTRAMUSCULAR | Status: DC | PRN

## 2020-10-01 MED ORDER — SODIUM CHLORIDE 0.9 % IV BOLUS
1000.0000 mL | Freq: Once | INTRAVENOUS | Status: AC
  Administered 2020-10-01: 1000 mL via INTRAVENOUS

## 2020-10-01 MED ORDER — MORPHINE SULFATE (PF) 2 MG/ML IV SOLN: 2.0000 mg | INTRAVENOUS | Status: DC | PRN

## 2020-10-01 MED ORDER — LACTATED RINGERS IV BOLUS
1000.0000 mL | Freq: Once | INTRAVENOUS | Status: AC
  Administered 2020-10-01: 1000 mL via INTRAVENOUS

## 2020-10-01 MED ORDER — SODIUM CHLORIDE 0.9 % IV SOLN
INTRAVENOUS | Status: DC | PRN
  Administered 2020-10-01 (×2): 250 mL via INTRAVENOUS

## 2020-10-01 MED ORDER — KETOROLAC TROMETHAMINE 30 MG/ML IJ SOLN
INTRAMUSCULAR | Status: AC
  Filled 2020-10-01: qty 1

## 2020-10-01 MED ORDER — ONDANSETRON HCL 4 MG/2ML IJ SOLN
4.0000 mg | Freq: Once | INTRAMUSCULAR | Status: AC
  Administered 2020-10-01: 4 mg via INTRAVENOUS
  Filled 2020-10-01: qty 2

## 2020-10-01 SURGICAL SUPPLY — 28 items
BLADE SURG 15 STRL LF DISP TIS (BLADE) ×1 IMPLANT
BLADE SURG 15 STRL SS (BLADE) ×1
DRAIN PENROSE 12X.25 LTX STRL (MISCELLANEOUS) IMPLANT
DRAIN PENROSE 5/8X18 LTX STRL (DRAIN) IMPLANT
DRAPE LAPAROTOMY 77X122 PED (DRAPES) ×2 IMPLANT
ELECT CAUTERY BLADE 6.4 (BLADE) ×2 IMPLANT
ELECT REM PT RETURN 9FT ADLT (ELECTROSURGICAL) ×2
ELECTRODE REM PT RTRN 9FT ADLT (ELECTROSURGICAL) ×1 IMPLANT
GAUZE 4X4 16PLY ~~LOC~~+RFID DBL (SPONGE) ×2 IMPLANT
GAUZE PACKING IODOFORM 1/2 (PACKING) ×2 IMPLANT
GAUZE SPONGE 4X4 12PLY STRL (GAUZE/BANDAGES/DRESSINGS) ×2 IMPLANT
GLOVE SURG ORTHO LTX SZ7.5 (GLOVE) ×2 IMPLANT
GOWN STRL REUS W/ TWL LRG LVL3 (GOWN DISPOSABLE) ×2 IMPLANT
GOWN STRL REUS W/TWL LRG LVL3 (GOWN DISPOSABLE) ×2
KIT TURNOVER KIT A (KITS) ×2 IMPLANT
MANIFOLD NEPTUNE II (INSTRUMENTS) ×2 IMPLANT
NEEDLE HYPO 22GX1.5 SAFETY (NEEDLE) ×2 IMPLANT
NS IRRIG 1000ML POUR BTL (IV SOLUTION) ×2 IMPLANT
PACK BASIN MINOR ARMC (MISCELLANEOUS) ×2 IMPLANT
PAD ABD DERMACEA PRESS 5X9 (GAUZE/BANDAGES/DRESSINGS) ×2 IMPLANT
SOL PREP PVP 2OZ (MISCELLANEOUS) ×2
SOLUTION PREP PVP 2OZ (MISCELLANEOUS) ×1 IMPLANT
SPONGE T-LAP 18X18 ~~LOC~~+RFID (SPONGE) ×2 IMPLANT
SUT ETHILON 3-0 FS-10 30 BLK (SUTURE)
SUTURE EHLN 3-0 FS-10 30 BLK (SUTURE) IMPLANT
SWAB CULTURE AMIES ANAERIB BLU (MISCELLANEOUS) ×2 IMPLANT
SYR 10ML LL (SYRINGE) ×2 IMPLANT
SYR BULB IRRIG 60ML STRL (SYRINGE) ×2 IMPLANT

## 2020-10-01 NOTE — ED Notes (Signed)
US at bedside

## 2020-10-01 NOTE — Op Note (Signed)
Incision and drainage right femoral lymph node abscess.  Pre-operative Diagnosis: Abscess right femoral lymph node, excoriation and intertriginous candidiasis.  Chronic left inguinal/scrotal hernia. Poorly controlled diabetes mellitus.  Post-operative Diagnosis: same.    Surgeon: Campbell Lerner, M.D., FACS  Assistant: Milagros Reap, PA student  Anesthesia: General  Findings: All tissues are clearly viable, deep soft tissue abscess potentially arising from within lymph node, no particular lymph node identifiable for possible biopsy.  Adequate, and well drained without any evidence of additional tracking or tunneling.  Estimated Blood Loss: 20 mL         Specimens: Culture and sensitivity.          Complications: none              Procedure Details  The patient was seen again in the Holding Room. The benefits, complications, treatment options, and expected outcomes were discussed with the patient. The risks of bleeding, infection, recurrence of symptoms, failure to resolve symptoms, unanticipated injury, prosthetic placement, prosthetic infection, any of which could require further surgery were reviewed with the patient. The likelihood of improving the patient's symptoms with return to their baseline status is hopeful.  The patient and/or family concurred with the proposed plan, giving informed consent.  The patient was taken to Operating Room, identified and the procedure verified.    Prior to the induction of general anesthesia, antibiotic prophylaxis was administered. VTE prophylaxis was in place.   General anesthesia was then administered and tolerated well. After the induction, the patient was positioned in the supine/frog-leg position and the Right groin/proximal thigh was prepped with Chloraprep and draped in the sterile fashion.  A Time Out was held and the above information confirmed.  A longitudinal incision was made immediately over the indurated mass, hemostasis was maintained  with electrosurgery continuing through the soft tissues.  I then bluntly opened the abscess cavity, obtains specimen for culture and sensitivity.  Then extended the incision to ensure adequate wide drainage and evaluation of the adjacent soft tissues.  Estimated incision size about 7 to 10 cm.  Depth is to the deep muscular fascia.  All fascia and soft tissues appear grossly viable.  Hemostasis obtained with electrocautery.  The wound was then irrigated with saline solution.  Wound is then packed with half-inch iodoform packing strip, cover dressing with ABD pad.  Patient tolerated procedure well.  Will undergo daily/twice daily dressing changes.  Appreciate hospitalist admission for IV antibiotics and diabetic management.  After dressing was placed I did attempt further reduction of his left scrotal hernia.  Although not much improvement was made, it was clearly evident that this was chronic and not acutely exacerbated I.e. obstruction/incarceration as there was no edema or swelling in the left groin region.         Campbell Lerner M.D., [facility] 10/01/2020 4:43 PM

## 2020-10-01 NOTE — Progress Notes (Signed)
Pharmacy Antibiotic Note  [name] is a 50 y.o. male admitted on 10/01/2020 with sepsis and abscess in groin .  Pharmacy has been consulted for Vancomycin and Zosyn dosing. Patient is going for I&D later this afternoon.  Plan: Zosyn 3.375g IV q8h (4 hour infusion).  Patient received Vancomycin 1250mg  IV in the ED at 05:46 Will begin Vancomycin 1500 mg IV Q 24 hrs starting 12 hrs after initial dose. Goal AUC 400-550. Expected AUC: 488 SCr used: 0.98 Expected Cmin: 9.7 mcg/ml   Height: 5\' 2"  (157.5 cm) Weight: 99.8 kg (220 lb) IBW/kg (Calculated) : 54.6  Temp (24hrs), Avg:99 F (37.2 C), Min:98.8 F (37.1 C), Max:99.1 F (37.3 C)  Recent Labs  Lab 10/01/20 0533 10/01/20 0713  WBC 9.0  --   CREATININE 0.98  --   LATICACIDVEN 1.2 1.4    Estimated Creatinine Clearance: 91.7 mL/min (by C-G formula based on SCr of 0.98 mg/dL).    Allergies  Allergen Reactions   Morphine And Related Rash    Antimicrobials this admission: Zosyn 7/13 >>  Vancomycin 7/13 >>   Dose adjustments this admission:  Microbiology results:   Thank you for allowing pharmacy to be a part of this patient's care.  8/13, PharmD, BCPS 10/01/2020 1:46 PM

## 2020-10-01 NOTE — Anesthesia Postprocedure Evaluation (Signed)
Anesthesia Post Note  Patient: [name]  Procedure(s) Performed: INCISION AND DRAINAGE ABSCESS (Right: Groin)  Patient location during evaluation: PACU Anesthesia Type: General Level of consciousness: awake and alert Pain management: pain level controlled Vital Signs Assessment: post-procedure vital signs reviewed and stable Respiratory status: spontaneous breathing, nonlabored ventilation, respiratory function stable and patient connected to nasal cannula oxygen Cardiovascular status: blood pressure returned to baseline and stable Postop Assessment: no apparent nausea or vomiting Anesthetic complications: no   No notable events documented.   Last Vitals:  Vitals:   10/01/20 1715 10/01/20 1730  BP: 127/79 122/75  Pulse: 90 89  Resp: 18 16  Temp:    SpO2: 100% 99%    Last Pain:  Vitals:   10/01/20 1730  TempSrc:   PainSc: 6                  Corinda Gubler

## 2020-10-01 NOTE — Consult Note (Signed)
Urology Consult  I have been asked to see the patient by Dr. Clyde Lundborg, for evaluation and management of bilateral hydronephrosis.  Chief Complaint: Right inguinal abscess  History of Present Illness: [name] is a 50 y.o. year old male with diabetes and HIV off pharmacotherapy for several years who presented to the ED this morning with reports of right groin swelling and erythema consistent with inguinal abscess.  He underwent CT AP with contrast which revealed mild bilateral hydroureteronephrosis to the level of the bladder.  Estimated bladder volume 270 mL.  He has a 7 mm nonobstructing left lower pole renal stone.  Additionally, he was noted to have a large left inguinal hernia containing colon with mesentery and causing mass-effect on the bladder.  No evidence of prostatic enlargement on imaging.  Foley catheter has been placed.  Admission labs notable for creatinine 0.98 (recent baseline unavailable, however stably ~1.1 in 2014) and UA with >/=500 mg/dL glucose, 30 mg/dL protein, 0-5 RBCs/hpf, 11-20 WBCs/hpf, and no bacteria.  On chart review, patient underwent CTAP without contrast on 10/27/2019.  On imaging review, bilateral hydronephrosis appears chronic and unchanged.  Today he denies a history of flank pain, dysuria, or difficulty urinating.  Foley catheter in place draining clear, yellow urine.  He states he underwent urologic surgery approximately 7 years ago for management of left-sided kidney stones.  He is scheduled for right inguinal I&D with general surgery later today.  History reviewed. No pertinent past medical history.  Past Surgical History:  Procedure Laterality Date   APPENDECTOMY      Home Medications:  No outpatient medications have been marked as taking for the 10/01/20 encounter [facility] Encounter).    Allergies: Not on File  No family history on file.  Social History:  has no history on file for tobacco use, alcohol use, and drug use.  ROS: A complete  review of systems was performed.  All systems are negative except for pertinent findings as noted.  Physical Exam:  Vital signs in last 24 hours: Temp:  [98.8 F (37.1 C)] 98.8 F (37.1 C) (07/13 0502) Pulse Rate:  [89-99] 89 (07/13 0903) Resp:  [14-28] 14 (07/13 0751) BP: (135-151)/(81-88) 140/82 (07/13 0903) SpO2:  [99 %-100 %] 100 % (07/13 0903) Constitutional:  Alert and oriented, no acute distress HEENT: [place] AT, moist mucus membranes Cardiovascular: No clubbing, cyanosis, or edema Respiratory: Normal respiratory effort Skin: No rashes, bruises or suspicious lesions Neurologic: Grossly intact, no focal deficits, moving all 4 extremities Psychiatric: Normal mood and affect  Laboratory Data:  Recent Labs    10/01/20 0533  WBC 9.0  HGB 12.7*  HCT 34.7*   Recent Labs    10/01/20 0533  NA 127*  K 3.6  CL 96*  CO2 20*  GLUCOSE 407*  BUN 21*  CREATININE 0.98  CALCIUM 8.2*   Recent Labs    10/01/20 0533  INR 1.0   Urinalysis    Component Value Date/Time   COLORURINE YELLOW (A) 10/01/2020 0713   APPEARANCEUR CLEAR (A) 10/01/2020 0713   APPEARANCEUR Clear 04/11/2012 0419   LABSPEC 1.017 10/01/2020 0713   LABSPEC 1.016 04/11/2012 0419   PHURINE 7.0 10/01/2020 0713   GLUCOSEU >=500 (A) 10/01/2020 0713   GLUCOSEU Negative 04/11/2012 0419   HGBUR SMALL (A) 10/01/2020 0713   BILIRUBINUR NEGATIVE 10/01/2020 0713   BILIRUBINUR Negative 04/11/2012 0419   KETONESUR 5 (A) 10/01/2020 0713   PROTEINUR 30 (A) 10/01/2020 0713   NITRITE NEGATIVE 10/01/2020 0713   LEUKOCYTESUR  SMALL (A) 10/01/2020 0713   LEUKOCYTESUR Trace 04/11/2012 0419   Results for orders placed or performed during the hospital encounter of 10/01/20  Resp Panel by RT-PCR (Flu A&B, Covid) Nasopharyngeal Swab     Status: None   Collection Time: 10/01/20  5:33 AM   Specimen: Nasopharyngeal Swab; Nasopharyngeal(NP) swabs in vial transport medium  Result Value Ref Range Status   SARS Coronavirus 2 by RT  PCR NEGATIVE NEGATIVE Final    Comment: (NOTE) SARS-CoV-2 target nucleic acids are NOT DETECTED.  The SARS-CoV-2 RNA is generally detectable in upper respiratory specimens during the acute phase of infection. The lowest concentration of SARS-CoV-2 viral copies this assay can detect is 138 copies/mL. A negative result does not preclude SARS-Cov-2 infection and should not be used as the sole basis for treatment or other patient management decisions. A negative result may occur with  improper specimen collection/handling, submission of specimen other than nasopharyngeal swab, presence of viral mutation(s) within the areas targeted by this assay, and inadequate number of viral copies(<138 copies/mL). A negative result must be combined with clinical observations, patient history, and epidemiological information. The expected result is Negative.  Fact Sheet for Patients:  BloggerCourse.com  Fact Sheet for Healthcare Providers:  SeriousBroker.it  This test is no t yet approved or cleared by the [place] FDA and  has been authorized for detection and/or diagnosis of SARS-CoV-2 by FDA under an Emergency Use Authorization (EUA). This EUA will remain  in effect (meaning this test can be used) for the duration of the COVID-19 declaration under Section 564(b)(1) of the Act, 21 U.S.C.section 360bbb-3(b)(1), unless the authorization is terminated  or revoked sooner.       Influenza A by PCR NEGATIVE NEGATIVE Final   Influenza B by PCR NEGATIVE NEGATIVE Final    Comment: (NOTE) The Xpert Xpress SARS-CoV-2/FLU/RSV plus assay is intended as an aid in the diagnosis of influenza from Nasopharyngeal swab specimens and should not be used as a sole basis for treatment. Nasal washings and aspirates are unacceptable for Xpert Xpress SARS-CoV-2/FLU/RSV testing.  Fact Sheet for Patients: BloggerCourse.com  Fact Sheet for  Healthcare Providers: SeriousBroker.it  This test is not yet approved or cleared by the [place] FDA and has been authorized for detection and/or diagnosis of SARS-CoV-2 by FDA under an Emergency Use Authorization (EUA). This EUA will remain in effect (meaning this test can be used) for the duration of the COVID-19 declaration under Section 564(b)(1) of the Act, 21 U.S.C. section 360bbb-3(b)(1), unless the authorization is terminated or revoked.  Performed at [facility], [address] [place], [place] [address]   Blood Culture (routine x 2)     Status: None (Preliminary result)   Collection Time: 10/01/20  5:34 AM   Specimen: BLOOD  Result Value Ref Range Status   Specimen Description BLOOD BLOOD RIGHT FOREARM  Final   Special Requests   Final    BOTTLES DRAWN AEROBIC AND ANAEROBIC Blood Culture adequate volume   Culture   Final    NO GROWTH <12 HOURS Performed at [facility], [address], [place], [place] [address]    Report Status PENDING  Incomplete  Blood Culture (routine x 2)     Status: None (Preliminary result)   Collection Time: 10/01/20  5:34 AM   Specimen: BLOOD  Result Value Ref Range Status   Specimen Description BLOOD BLOOD LEFT FOREARM  Final   Special Requests   Final    BOTTLES DRAWN AEROBIC AND  ANAEROBIC Blood Culture adequate volume   Culture   Final    NO GROWTH <12 HOURS Performed at [facility], [address] [place], [place] [address]    Report Status PENDING  Incomplete    Radiologic Imaging: CT ABDOMEN PELVIS W CONTRAST  Result Date: 10/01/2020 CLINICAL DATA:  50 year old male with massive scrotal swelling and possible superimposed left inguinal hernia. Right proximal thigh abscess. Diabetes and HIV. EXAM: CT ABDOMEN AND PELVIS WITH CONTRAST TECHNIQUE: Multidetector CT imaging of the abdomen and pelvis was performed using the standard protocol following bolus administration of  intravenous contrast. CONTRAST:  100mL OMNIPAQUE IOHEXOL 350 MG/ML SOLN COMPARISON:  Scrotal ultrasound with Doppler today reported separately. CT Abdomen and Pelvis 10/26/2009. FINDINGS: Lower chest: Cardiac size at the upper limits of normal. Otherwise negative. Hepatobiliary: Negative liver and gallbladder. Pancreas: Negative. Spleen: Borderline splenomegaly. Adrenals/Urinary Tract: Normal adrenal glands. Left lower pole nephrolithiasis measures 6 mm. There is mild bilateral hydronephrosis and hydroureter. Both ureters are mildly dilated to the bladder. And the bladder is mildly distended and deviated to the right from mass effect related to the large left inguinal hernia detailed below. Estimated bladder volume is 270 mL. No perivesical stranding. No obstructing calculus. On delayed images renal contrast excretion is relatively symmetric. There is a small benign upper pole right renal cyst with simple fluid density. Stomach/Bowel: Negative rectum. Large left inguinal hernia containing the sigmoid colon is detailed below. Upstream descending colon is nondilated with retained stool. There is fluid in the right colon and transverse colon. Diminutive or absent appendix. Decompressed terminal ileum. No dilated small bowel. Decompressed stomach and duodenum. No free air. No free fluid. Vascular/Lymphatic: Asymmetric right external iliac chain lymphadenopathy, with nodes up to 12 mm short axis. Increased number of small right inguinal lymph nodes. And best seen on coronal image 53, and enlarged roughly 2.2 cm short axis node is intimately associated with roughly 7 cm of heterogeneous low-density phlegmon tracking distally into the medial left thigh from the node. There is faint rim enhancement near the node, but mostly this collection which encompasses 7 x 3.6 x 4.1 cm is indistinct and non organized. Surrounding subcutaneous inflammatory stranding. The collection is superficial to the anterior muscle layer.  Superimposed proximal femoral arteries appear patent. No other lymphadenopathy in the abdomen or pelvis. Other major arterial structures in the abdomen and pelvis are patent with minimal atherosclerosis. Portal venous system appears patent. Reproductive: Large left inguinal hernia. A roughly 18 cm hernia sac contains up to 30 cm of the distal descending and sigmoid colon and mesentery. But no associated bowel or mesenteric inflammation within the sac. Additional bowel details above. See also scrotal Doppler findings today. Other: No pelvic free fluid. Musculoskeletal: No acute osseous abnormality identified. IMPRESSION: 1. Large left inguinal hernia containing roughly 30 cm of the distal descending and sigmoid colon with mesentery. No evidence of incarceration. No bowel obstruction. There is associated mass effect on the urinary bladder (see #3). 2. Abnormal right inguinal and proximal thigh soft tissues most suggestive of a suppurative right inguinal lymph node with subsequent 7.2 by 3.6 x 4.1 cm phlegmon tracking from the lower pole of the node out into the thigh. See coronal image 53. In conjunction with the ultrasound appearance today (reported separately) suspect this is a developing abscess although may not be drainable at this time. 3. Mild symmetric bilateral hydronephrosis and hydroureter is associated with urinary bladder mass effect and distension (270 mL). Consider bladder outlet obstruction. Left lower  pole nephrolithiasis. Electronically Signed   By: Odessa Fleming M.D.   On: 10/01/2020 07:43   DG Chest Port 1 View  Result Date: 10/01/2020 CLINICAL DATA:  50 year old male with possible sepsis. EXAM: PORTABLE CHEST 1 VIEW COMPARISON:  Chest radiographs 04/12/2012 and earlier. FINDINGS: Portable AP upright view at 0535 hours. Lung volumes and mediastinal contours are stable and within normal limits. Mild eventration of the right hemidiaphragm, normal variant. Visualized tracheal air column is within normal  limits. Allowing for portable technique the lungs are clear. No pneumothorax or pleural effusion. No acute osseous abnormality identified. Negative visible bowel gas. IMPRESSION: Negative portable chest. Electronically Signed   By: Odessa Fleming M.D.   On: 10/01/2020 05:52   US SCROTUM W/DOPPLER  Result Date: 10/01/2020 CLINICAL DATA:  50 year old male with massive scrotal swelling and possible superimposed left inguinal hernia. Right proximal thigh abscess. Diabetes and HIV. EXAM: SCROTAL ULTRASOUND DOPPLER ULTRASOUND OF THE TESTICLES TECHNIQUE: Complete ultrasound examination of the testicles, epididymis, and other scrotal structures was performed. Color and spectral Doppler ultrasound were also utilized to evaluate blood flow to the testicles. COMPARISON:  CT Abdomen and Pelvis 0. 716 hours the same day reported separately FINDINGS: Right testicle Measurements: 4.0 x 2.1 x 2.5 cm. No mass or microlithiasis visualized. Left testicle Measurements: 3.8 x 2.0 x 3.1 cm. No mass or microlithiasis visualized. Right epididymis:  Normal in size and appearance. Left epididymis:  Normal in size and appearance. Hydrocele:  Small left-side simple appearing hydrocele. Varicocele:  None visualized. Pulsed Doppler interrogation of both testes demonstrates normal low resistance arterial and venous waveforms bilaterally. Other findings: Subcutaneous edema in the right inguinal region. An 1.1 cm short axis right inguinal node is visible (image 63), and seems contiguous with an irregular roughly 7.2 x 2.5 x 4.3 cm subcutaneous collection which is largely nonvascular (image 68) with overlying subcutaneous edema (image 61). Furthermore, there appears to be peristalsing bowel between the testes in the scrotum (image 39). IMPRESSION: 1. Negative for testicular mass or torsion. 2. Positive for an abnormal right inguinal lymph node which appears associated with a roughly 7.2 cm phlegmon or abscess, See CT Abdomen and Pelvis today reported  separately. 3. Evidence of herniated bowel in the scrotum, see details also CT Abdomen and Pelvis. Electronically Signed   By: Odessa Fleming M.D.   On: 10/01/2020 07:33    Assessment & Plan:  50 year old male with diabetes and HIV off pharmacotherapy for several years as well as a large left bowel containing inguinal hernia who presents with right inguinal abscess.  On imaging, he was found incidentally to have bilateral hydroureteronephrosis to the level of the bladder.  On chart review, bilateral hydroureteronephrosis is chronic and unchanged.  With stable, normal creatinine, no indication for intervention at this time.  Bladder volume was not significantly elevated on cross-sectional imaging today and no evidence of prostatomegaly.  Okay to discontinue Foley catheter from the urologic perspective in the absence of urinary retention, though I suspect general surgery will prefer to keep it at least in the short-term for wound management purposes.  UA not particularly concerning for infection, though may follow urine cultures given pyuria.  Ultimately, patient should undergo PSA screening to rule out underlying prostate malignancy, though would prefer to defer this given Foley catheter placement today which will falsely elevate result.  Okay to plan for outpatient follow-up with DRE and PSA screening in our clinic.  Thank you for involving me in this patient's care, please page with  any further questions or concerns.  Carman Ching, PA-C 10/01/2020 11:02 AM

## 2020-10-01 NOTE — ED Notes (Signed)
Telephone report given to sylvia in surgery

## 2020-10-01 NOTE — Anesthesia Procedure Notes (Addendum)
Procedure Name: Intubation Date/Time: 10/01/2020 3:54 PM Performed by: Joanette Gula, Kush Farabee, CRNA Pre-anesthesia Checklist: Patient identified, Emergency Drugs available, Suction available and Patient being monitored Patient Re-evaluated:Patient Re-evaluated prior to induction Oxygen Delivery Method: Circle system utilized Preoxygenation: Pre-oxygenation with 100% oxygen Induction Type: IV induction and Rapid sequence Laryngoscope Size: McGraph and 4 Grade View: Grade I Tube type: Oral Tube size: 7.0 mm Number of attempts: 1 Airway Equipment and Method: Stylet Placement Confirmation: ETT inserted through vocal cords under direct vision, positive ETCO2 and breath sounds checked- equal and bilateral Secured at: 21 cm Tube secured with: Tape Dental Injury: Teeth and Oropharynx as per pre-operative assessment

## 2020-10-01 NOTE — Transfer of Care (Signed)
Immediate Anesthesia Transfer of Care Note  Patient: [name]  Procedure(s) Performed: INCISION AND DRAINAGE ABSCESS (Right: Groin)  Patient Location: PACU  Anesthesia Type:General  Level of Consciousness: awake, alert  and oriented  Airway & Oxygen Therapy: Patient Spontanous Breathing and Patient connected to face mask oxygen  Post-op Assessment: Report given to RN and Post -op Vital signs reviewed and stable  Post vital signs: Reviewed and stable  Last Vitals:  Vitals Value Taken Time  BP 143/81 10/01/20 1641  Temp    Pulse 98 10/01/20 1642  Resp 32 10/01/20 1642  SpO2 100 % 10/01/20 1642  Vitals shown include unvalidated device data.  Last Pain:  Vitals:   10/01/20 1434  TempSrc: Oral  PainSc: 0-No pain         Complications: No notable events documented.

## 2020-10-01 NOTE — ED Provider Notes (Signed)
[facility] Emergency Department Provider Note ____________________________________________   Event Date/Time   First MD Initiated Contact with Patient 10/01/20 [phone]     (approximate)  I have reviewed the triage vital signs and the nursing notes.  HISTORY  Chief Complaint No chief complaint on file.   HPI [name] is a 50 y.o. malewho presents to the ED for evaluation of groin pain and swelling.   Chart review indicates no relevant hx.  Patient self-reports a history of diabetes and HIV for which he has not been taking medications for multiple years due to the pandemic.  Patient presents to the ED for evaluation of scrotal and right inguinal pain progressively worsening over the past 2 days.  He reports associated symptoms of chills and subjective fevers, nausea without emesis.  Denies difficulty voiding or dysuria.  No recent antibiotics.  This has never happened before.  History and physical obtained with the assistance of Spanish interpreter.  History reviewed. No pertinent past medical history.  There are no problems to display for this patient.   Past Surgical History:  Procedure Laterality Date   APPENDECTOMY      Prior to Admission medications   Not on File    Allergies Patient has no allergy information on record.  No family history on file.  Social History    Review of Systems  Constitutional: No fever/chills Eyes: No visual changes. ENT: No sore throat. Cardiovascular: Denies chest pain. Respiratory: Denies shortness of breath. Gastrointestinal: Positive for nausea no vomiting.  No diarrhea.  No constipation. Genitourinary: Negative for dysuria. Positive for atraumatic scrotal swelling and pain Musculoskeletal: Negative for back pain. Skin: Negative for rash. Neurological: Negative for headaches, focal weakness or numbness.  ____________________________________________   PHYSICAL EXAM:  VITAL SIGNS: Vitals:    10/01/20 0502 10/01/20 0606  BP: 135/82 (!) 150/84  Pulse: 99 95  Resp: 20 (!) 28  Temp: 98.8 F (37.1 C)   SpO2: 99% 100%     Constitutional: Alert and oriented.  Uncomfortable-appearing. Eyes: Conjunctivae are normal. PERRL. EOMI. Head: Atraumatic. Nose: No congestion/rhinnorhea. Mouth/Throat: Mucous membranes are moist.  Oropharynx non-erythematous. Neck: No stridor. No cervical spine tenderness to palpation. Cardiovascular: Borderline tachycardia, regular rhythm. Grossly normal heart sounds.  Good peripheral circulation. Respiratory: Normal respiratory effort.  No retractions. Lungs CTAB. Gastrointestinal: Soft , nondistended, mild and poorly localizing lower abdominal tenderness to palpation without peritoneal features.  Upper abdomen is benign.Marland Kitchen No CVA tenderness. GU: Grossly and obviously distended and swollen scrotum bilaterally, left greater than right.  Does seem to have some inguinal fullness on the left that is slightly tender.  Palpable bilateral testicles.  Indurated skin throughout much of the scrotum, particularly the left sided and inferior sided portions. Musculoskeletal:  No joint effusions. No signs of acute trauma. 3 x 6 cm area of soft tissue swelling and exquisite tenderness, more tender than the scrotum. Neurologic:  Normal speech and language. No gross focal neurologic deficits are appreciated. No gait instability noted. Skin:  Skin is warm, dry and intact. Psychiatric: Mood and affect are normal. Speech and behavior are normal.  ____________________________________________   LABS (all labs ordered are listed, but only abnormal results are displayed)  Labs Reviewed  COMPREHENSIVE METABOLIC PANEL - Abnormal; Notable for the following components:      Result Value   Sodium 127 (*)    Chloride 96 (*)    CO2 20 (*)    Glucose, Bld 407 (*)    BUN 21 (*)  Calcium 8.2 (*)    Albumin 3.0 (*)    Alkaline Phosphatase 127 (*)    All other components within  normal limits  CBC WITH DIFFERENTIAL/PLATELET - Abnormal; Notable for the following components:   Hemoglobin 12.7 (*)    HCT 34.7 (*)    MCHC 36.6 (*)    Platelets 109 (*)    All other components within normal limits  BETA-HYDROXYBUTYRIC ACID - Abnormal; Notable for the following components:   Beta-Hydroxybutyric Acid 0.77 (*)    All other components within normal limits  URINE CULTURE  CULTURE, BLOOD (ROUTINE X 2)  CULTURE, BLOOD (ROUTINE X 2)  RESP PANEL BY RT-PCR (FLU A&B, COVID) ARPGX2  LACTIC ACID, PLASMA  PROTIME-INR  APTT  LACTIC ACID, PLASMA  URINALYSIS, COMPLETE (UACMP) WITH MICROSCOPIC  PROCALCITONIN  TYPE AND SCREEN   ____________________________________________  12 Lead EKG  Sinus rhythm, rate of 89 bpm.  Normal axis and intervals.  No evidence of acute ischemia. ____________________________________________  RADIOLOGY  ED MD interpretation: 1 view CXR reviewed by me without evidence of acute cardiopulmonary pathology.  Scrotal ultrasound and CT abdomen/pelvis pending at the time of signout to oncoming provider.  Official radiology report(s): DG Chest Port 1 View  Result Date: 10/01/2020 CLINICAL DATA:  50 year old male with possible sepsis. EXAM: PORTABLE CHEST 1 VIEW COMPARISON:  Chest radiographs 04/12/2012 and earlier. FINDINGS: Portable AP upright view at 0535 hours. Lung volumes and mediastinal contours are stable and within normal limits. Mild eventration of the right hemidiaphragm, normal variant. Visualized tracheal air column is within normal limits. Allowing for portable technique the lungs are clear. No pneumothorax or pleural effusion. No acute osseous abnormality identified. Negative visible bowel gas. IMPRESSION: Negative portable chest. Electronically Signed   By: Odessa Fleming M.D.   On: 10/01/2020 05:52    ____________________________________________   PROCEDURES and INTERVENTIONS  Procedure(s) performed (including Critical Care):  .1-3 Lead EKG  Interpretation  Date/Time: 10/01/2020 6:50 AM Performed by: Delton Prairie, MD Authorized by: Delton Prairie, MD     Interpretation: normal     ECG rate:  98   ECG rate assessment: normal     Rhythm: sinus rhythm     Ectopy: none     Conduction: normal    Medications  vancomycin (VANCOREADY) IVPB 1250 mg/250 mL (1,250 mg Intravenous New Bag/Given 10/01/20 0546)  morphine 4 MG/ML injection 4 mg (4 mg Intravenous Given 10/01/20 0549)  ondansetron (ZOFRAN) injection 4 mg (4 mg Intravenous Given 10/01/20 0549)  lactated ringers bolus 1,000 mL (1,000 mLs Intravenous New Bag/Given 10/01/20 0547)  ceFEPIme (MAXIPIME) 2 g in sodium chloride 0.9 % 100 mL IVPB (2 g Intravenous New Bag/Given 10/01/20 0550)    ____________________________________________   MDM / ED COURSE   51 year old male presents to the ED with a couple days of increasing scrotal and proximal right thigh swelling and pain.  Patient hemodynamically stable with borderline sinus tachycardia.  He appears quite uncomfortable on examination.  He does have some mild tenderness to his lower abdomen, but no localizing or peritoneal features. His GU examination is concerning for the possibility of of large left-sided inguinal hernia, 40s gangrene, scrotal cellulitis and abscess. He seems to have a separate lesion that does not communicate to his scrotum, located to the very proximal right medial inner thigh, just distal to his inguinal crease.  This seems to have the characteristics of a superficial abscess. Blood work demonstrates hyperglycemia and pseudohyponatremia without an anion gap to suggest DKA.  Provided empiric  cefepime and vancomycin as I am awaiting scrotal ultrasound and CT abdomen/pelvis to evaluate for inguinal hernia versus signs of Fournier's gangrene.  Anticipate the need for medical admission regardless of these diagnostic studies and patient signed out to oncoming provider to follow-up on these tests.    Clinical Course as of  10/01/20 0654  Wed Oct 01, 2020  0543 reassessed [DS]  0624 Reassessed while ultrasound technician is performing scrotal ultrasound. [DS]    Clinical Course User Index [DS] Delton Prairie, MD    ____________________________________________   FINAL CLINICAL IMPRESSION(S) / ED DIAGNOSES  Final diagnoses:  Scrotal swelling     ED Discharge Orders     None        Davyd Podgorski Katrinka Blazing   Note:  This document was prepared using Dragon voice recognition software and may include unintentional dictation errors.    Delton Prairie, MD 10/01/20 [phone]

## 2020-10-01 NOTE — ED Provider Notes (Signed)
-----------------------------------------   8:36 AM on 10/01/2020 ----------------------------------------- Surgery is seen the patient.  We will admit to the hospitalist for IV antibiotics with surgery consultation for likely drainage.  Patient agreeable to plan of care.  After speaking to the patient with the interpreter, states the left inguinal hernia has been present for approximately 8 months.   Minna Antis, MD 10/01/20 [phone]

## 2020-10-01 NOTE — Anesthesia Preprocedure Evaluation (Signed)
Anesthesia Evaluation  Patient identified by MRN, date of birth, ID band Patient awake  General Assessment Comment:  Patient in visible pain  Reviewed: Allergy & Precautions, NPO status , Patient's Chart, lab work & pertinent test results  History of Anesthesia Complications Negative for: history of anesthetic complications  Airway Mallampati: II  TM Distance: >3 FB Neck ROM: Full    Dental no notable dental hx. (+) Teeth Intact   Pulmonary neg pulmonary ROS, neg sleep apnea, neg COPD, Patient abstained from smoking.Not current smoker,    Pulmonary exam normal breath sounds clear to auscultation       Cardiovascular Exercise Tolerance: Good METS(-) hypertension(-) CAD and (-) Past MI negative cardio ROS  (-) dysrhythmias  Rhythm:Regular Rate:Normal - Systolic murmurs    Neuro/Psych negative neurological ROS  negative psych ROS   GI/Hepatic neg GERD  ,(+)     (-) substance abuse  ,   Endo/Other  diabetes, Poorly ControlledMorbid obesity  Renal/GU negative Renal ROS     Musculoskeletal   Abdominal   Peds  Hematology   Anesthesia Other Findings History reviewed. No pertinent past medical history.  Reproductive/Obstetrics                             Anesthesia Physical Anesthesia Plan  ASA: 3  Anesthesia Plan: General   Post-op Pain Management:    Induction: Intravenous and Rapid sequence  PONV Risk Score and Plan: 3 and Ondansetron, Dexamethasone and Midazolam  Airway Management Planned: Oral ETT  Additional Equipment: None  Intra-op Plan:   Post-operative Plan: Extubation in OR  Informed Consent: I have reviewed the patients History and Physical, chart, labs and discussed the procedure including the risks, benefits and alternatives for the proposed anesthesia with the patient or authorized representative who has indicated his/her understanding and acceptance.      Dental advisory given and Interpreter used for interveiw  Plan Discussed with: CRNA and Surgeon  Anesthesia Plan Comments: (Discussed risks of anesthesia with patient, including PONV, sore throat, lip/dental damage. Rare risks discussed as well, such as cardiorespiratory and neurological sequelae. Patient understands.)        Anesthesia Quick Evaluation

## 2020-10-01 NOTE — Consult Note (Signed)
[facility] SUR[facility] CON[facility]ULTATION NOTE (initial) - cpt: 99244   HISTORY OF PRESENT ILLNESS (HPI):  50 y.o. male presented to [facility] ED today for evaluation of groin pain. Patient reports the acute onset of right sided groin pain last night around 10PM. This has been severe and constant since the onset. Nothing seemed to make this better. He endorses a subjective fever, chills, SOB with the pain. He denied any history of similar in the past. He does have a history of poorly controlled DM, and :has not taken any medication since the pandemic." He is not checking blood glucose at home, and this was 407 here in the ED. Additionally, he does have a large left inguinal hernia which has been present over the last 8 months, but he denied any symptoms from this and has not sought medical attention. Previous surgeries is positive for appendectomy. Work up in the ED revealed a WBC to 9.0K, hyponatremia to 127, lactic acid level at 1.2. He did undergo scrotal US which was concerning for phlegmon vs developing abscess in the right inguinal region. Follow up CT was concerning for similar.   Surgery is consulted by emergency medicine physician Dr. Minna Antis, MD in this context for evaluation and management of possible right inguinal abscess.  PAST MEDICAL HISTORY (PMH):  History reviewed. No pertinent past medical history.   PAST SURGICAL HISTORY (PSH):  Past Surgical History:  Procedure Laterality Date   APPENDECTOMY       MEDICATIONS:  Prior to Admission medications   Not on File     ALLERGIES:  Not on File   SOCIAL HISTORY:  Social History   Socioeconomic History   Marital status: Married    Spouse name: Not on file   Number of children: Not on file   Years of education: Not on file   Highest education level: Not on file  Occupational History   Not on file  Tobacco Use   Smoking status: Not on file   Smokeless tobacco: Not on file  Substance and Sexual Activity    Alcohol use: Not on file   Drug use: Not on file   Sexual activity: Not on file  Other Topics Concern   Not on file  Social History Narrative   Not on file   Social Determinants of Health   Financial Resource Strain: Not on file  Food Insecurity: Not on file  Transportation Needs: Not on file  Physical Activity: Not on file  Stress: Not on file  Social Connections: Not on file  Intimate Partner Violence: Not on file     FAMILY HISTORY:  No family history on file.    REVIEW OF SYSTEMS:  Review of Systems  Constitutional:  Positive for chills (Subjective) and fever (Subjective).  Respiratory:  Negative for cough and shortness of breath.   Cardiovascular:  Negative for chest pain and palpitations.  Gastrointestinal:  Positive for abdominal pain (Right gorin). Negative for nausea and vomiting.  Genitourinary:  Negative for dysuria and urgency.  All other systems reviewed and are negative.  VITAL SIGNS:  Temp:  [98.8 F (37.1 C)] 98.8 F (37.1 C) (07/13 0502) Pulse Rate:  [89-99] 89 (07/13 0751) Resp:  [14-28] 14 (07/13 0751) BP: (135-151)/(81-88) 149/88 (07/13 0751) SpO2:  [99 %-100 %] 100 % (07/13 0751)             INTAKE/OUTPUT:  No intake/output data recorded.  PHYSICAL EXAM:  Physical Exam Vitals and nursing note reviewed. Exam conducted with a  chaperone present.  Constitutional:      General: He is not in acute distress.    Appearance: Normal appearance.     Comments: Patient appears uncomfortable   HENT:     Head: Normocephalic and atraumatic.  Cardiovascular:     Rate and Rhythm: Normal rate.     Pulses: Normal pulses.     Heart sounds: No murmur heard. Pulmonary:     Effort: Pulmonary effort is normal. No respiratory distress.  Abdominal:     General: There is no distension.     Palpations: Abdomen is soft.     Hernia: A hernia is present. Hernia is present in the left inguinal area.  Genitourinary:    Penis: Uncircumcised.      Comments: There is  a significant erythema, swelling, induration, and tenderness to the right inguinal region. There is significant Intertrigo in this area between scrotum and groin. On the left, he has a large non-tender left inguinal hernia.  Musculoskeletal:     Right lower leg: No edema.     Left lower leg: No edema.  Skin:    General: Skin is warm and dry.     Findings: Erythema present.  Neurological:     General: No focal deficit present.     Mental Status: He is alert and oriented to person, place, and time.  Psychiatric:        Mood and Affect: Mood normal.        Behavior: Behavior normal.     Labs:  CBC Latest Ref Rng & Units 10/01/2020 04/12/2012 04/11/2012  WBC 4.0 - 10.5 K/uL 9.0 7.7 8.4  Hemoglobin 13.0 - 17.0 g/dL 12.7(L) 14.5 15.2  Hematocrit 39.0 - 52.0 % 34.7(L) 40.8 42.8  Platelets 150 - 400 K/uL 109(L) 198 197   CMP Latest Ref Rng & Units 10/01/2020 04/12/2012 04/11/2012  Glucose 70 - 99 mg/dL 314(H) 702(O) 378(H)  BUN 6 - 20 mg/dL 88(F) 17 15  Creatinine 0.61 - 1.24 mg/dL 0.27 7.41 2.87  Sodium 135 - 145 mmol/L 127(L) 133(L) 134(L)  Potassium 3.5 - 5.1 mmol/L 3.6 4.3 3.6  Chloride 98 - 111 mmol/L 96(L) 102 103  CO2 22 - 32 mmol/L 20(L) 20(L) 24  Calcium 8.9 - 10.3 mg/dL 8.2(L) 8.4(L) 8.4(L)  Total Protein 6.5 - 8.1 g/dL 7.5 - 7.7  Total Bilirubin 0.3 - 1.2 mg/dL 1.2 - 0.5  Alkaline Phos 38 - 126 U/L 127(H) - 184(H)  AST 15 - 41 U/L 17 - 18  ALT 0 - 44 U/L 14 - 26     Imaging studies:   Scrotal US (10/01/2020) personally reviewed concerning for left inguinal abscess vs phlegmon, and radiologist report reviewed IMPRESSION: 1. Negative for testicular mass or torsion. 2. Positive for an abnormal right inguinal lymph node which appears associated with a roughly 7.2 cm phlegmon or abscess, See CT Abdomen and Pelvis today reported separately. 3. Evidence of herniated bowel in the scrotum, see details also CT Abdomen and Pelvis.  CT Abdomen/Pelvis (10/01/2020) personally  reviewed and again concerning for right inguinal abscess/phlegmon, large left inguinal hernia without incarceration or strangulation containing large intestine, and radiologist report reviewed below:  IMPRESSION: 1. Large left inguinal hernia containing roughly 30 cm of the distal descending and sigmoid colon with mesentery. No evidence of incarceration. No bowel obstruction. There is associated mass effect on the urinary bladder (see #3).   2. Abnormal right inguinal and proximal thigh soft tissues most suggestive of a suppurative right inguinal lymph  node with subsequent 7.2 by 3.6 x 4.1 cm phlegmon tracking from the lower pole of the node out into the thigh. See coronal image 53. In conjunction with the ultrasound appearance today (reported separately) suspect this is a developing abscess although may not be drainable at this time.   3. Mild symmetric bilateral hydronephrosis and hydroureter is associated with urinary bladder mass effect and distension (270 mL). Consider bladder outlet obstruction. Left lower pole nephrolithiasis.    Assessment/Plan:  50 y.o. male with likely right inguinal abscess (? Suppurative lymphadenopathy) and large left, asymptomatic, inguinal hernia without incarceration/strangulation/obstruction, complicated by pertinent comorbidities including uncontrolled DM.   - Appreciate medicine admission  - I do think he warrants I&D in the OR today, timing per Dr Claudine Mouton and Lyna Poser availability  - Using a spanish interpretor, All risks, benefits, and alternatives to above procedure(s) were discussed with the patient, all of his questions were answered to his expressed satisfaction, patient expresses he wishes to proceed, and informed consent was obtained.   - Left inguinal hernia is asymptomatic and without complication, this can be addressed at later date in outpatient setting  - NPO + IVF Resuscitation  - IV Abx  - Hyperglycemia control  - Pain control  prn  - further management per primary service; we will follow   All of the above findings and recommendations were discussed with the patient, and all of patient's questions were answered to his expressed satisfaction.  Thank you for the opportunity to participate in this patient's care.   -- Lynden Oxford, PA-C [facility] 10/01/2020, 8:37 AM [phone] M-F: 7am - 4pm

## 2020-10-01 NOTE — H&P (Signed)
 History and Physical    [name] MRN:[id number] DOB: 12/24/1970 DOA: 10/01/2020  Referring MD/NP/PA:   PCP: Pcp, No   Patient coming from:  The patient is coming from home.  At baseline, pt is independent for most of ADL.        Chief Complaint: Pain in right inguinal area  HPI: [name] is a 50 y.o. male with medical history significant of self-reported history of diabetes and HIV, not taking medications for several years, left inguinal hernia, kidney stone, who presents with pain in right inguinal area.  Patient speaks Spanish.  History is obtained with iPad interpreter's help. Pt states that he has pain in right groin area for almost 2 days, which has been progressively worsening.  The pain is constant, sharp, severe, nonradiating.  He also has erythema and swelling in the same area.  He states that he has a large left inguinal hernia, which has been going on for more than 8 months, which is basically asymptomatic.  Patient denies nausea, vomiting, diarrhea or abdominal pain.  No chest pain, cough, shortness breath.  Patient has fever and chills.  He also has dysuria, no burning on urination.  ED Course: pt was found to have WBC 9.0, negative COVID PCR, lactic acid of 1.2, INR 1.0, PTT 35, urinalysis (clear appearance, small amount of leukocyte, negative bacteria, WBC 11-20), blood sugar 407, anion gap 11, pseudohyponatremia with sodium 127 (corrected to 132-134), beta hydroxybutyric acid is 0.77.  Chest x-ray negative. Scrotal US finding is concerning for phlegmon vs developing abscess in the right inguinal region.  Pt is admitted to MedSurg bed as inpatient. Dr. Brandon of urology and Dr. Rodenberg of general surgery were consulted.  CT abdomen/pelvis: 1. Large left inguinal hernia containing roughly 30 cm of the distal descending and sigmoid colon with mesentery. No evidence of incarceration. No bowel obstruction. There is associated mass effect on the urinary bladder (see #3).    2. Abnormal right inguinal and proximal thigh soft tissues most suggestive of a suppurative right inguinal lymph node with subsequent 7.2 by 3.6 x 4.1 cm phlegmon tracking from the lower pole of the node out into the thigh. See coronal image 53. In conjunction with the ultrasound appearance today (reported separately) suspect this is a developing abscess although may not be drainable at this time.   3. Mild symmetric bilateral hydronephrosis and hydroureter is associated with urinary bladder mass effect and distension (270 mL). Consider bladder outlet obstruction. Left lower pole nephrolithiasis.    Review of Systems:   General: has fevers, chills, no body weight gain, has poor appetite, has fatigue HEENT: no blurry vision, hearing changes or sore throat Respiratory: no dyspnea, coughing, wheezing CV: no chest pain, no palpitations GI: no nausea, vomiting, abdominal pain, diarrhea, constipation GU: has dysuria, no burning on urination, increased urinary frequency, hematuria. Ext: no leg edema Neuro: no unilateral weakness, numbness, or tingling, no vision change or hearing loss Skin: has pain, redness and swelling in right inguinal area. MSK: No muscle spasm, no deformity, no limitation of range of movement in spin Heme: No easy bruising.  Travel history: No recent long distant travel.  Allergy:  Allergies  Allergen Reactions   Morphine And Related Rash    Past Medical History:  Diagnosis Date   Diabetes mellitus without complication (HCC)    HIV (human immunodeficiency virus infection) (HCC)    Kidney stone    Left inguinal hernia     Past Surgical History:  Procedure   Laterality Date   APPENDECTOMY     kidney stone sugery Left     Social History:  reports that he has never smoked. He has never used smokeless tobacco. He reports previous alcohol use. He reports that he does not use drugs.  Family History:  Family History  Problem Relation Age of Onset    Diabetes Mellitus II Mother      Prior to Admission medications   Not on File    Physical Exam: Vitals:   10/01/20 1645 10/01/20 1700 10/01/20 1715 10/01/20 1730  BP: 138/79 133/78 127/79 122/75  Pulse: 97 93 90 89  Resp: (!) _0 Temp:      TempSrc:      SpO2: 100% 100% 100% 99%  Weight:      Height:       General: Not in acute distress HEENT:       Eyes: PERRL, EOMI, no scleral icterus.       ENT: No discharge from the ears and nose, no pharynx injection, no tonsillar enlargement.        Neck: No JVD, no bruit, no mass felt. Heme: No neck lymph node enlargement. Cardiac: S1/S2, RRR, No murmurs, No gallops or rubs. Respiratory: No rales, wheezing, rhonchi or rubs. GI: Soft, nondistended, nontender, no rebound pain, no organomegaly, BS present. GU: Has erythema, swelling, induration, and tenderness to the right inguinal area . Has a large left inguinal hernia, no tenderness.  Ext: No pitting leg edema bilaterally. 1+DP/PT pulse bilaterally. Musculoskeletal: No joint deformities, No joint redness or warmth, no limitation of ROM in spin. Skin: No rashes.  Neuro: Alert, oriented X3, cranial nerves II-XII grossly intact, moves all extremities normally.  Psych: Patient is not psychotic, no suicidal or hemocidal ideation.  Labs on Admission: I have personally reviewed following labs and imaging studies  CBC: Recent Labs  Lab 10/01/20 0533  WBC 9.0  NEUTROABS 7.2  HGB 12.7*  HCT 34.7*  MCV 80.5  PLT 542*   Basic Metabolic Panel: Recent Labs  Lab 10/01/20 0533  NA 127*  K 3.6  CL 96*  CO2 20*  GLUCOSE 407*  BUN 21*  CREATININE 0.98  CALCIUM 8.2*   GFR: Estimated Creatinine Clearance: 91.7 mL/min (by C-G formula based on SCr of 0.98 mg/dL). Liver Function Tests: Recent Labs  Lab 10/01/20 0533  AST 17  ALT 14  ALKPHOS 127*  BILITOT 1.2  PROT 7.5  ALBUMIN 3.0*   No results for input(s): LIPASE, AMYLASE in the last 168 hours. No results for  input(s): AMMONIA in the last 168 hours. Coagulation Profile: Recent Labs  Lab 10/01/20 0533  INR 1.0   Cardiac Enzymes: No results for input(s): CKTOTAL, CKMB, CKMBINDEX, TROPONINI in the last 168 hours. BNP (last 3 results) No results for input(s): PROBNP in the last 8760 hours. HbA1C: Recent Labs    10/01/20 1102  HGBA1C 12.1*   CBG: Recent Labs  Lab 10/01/20 1226 10/01/20 1435 10/01/20 1645  GLUCAP 255* 223* 177*   Lipid Profile: No results for input(s): CHOL, HDL, LDLCALC, TRIG, CHOLHDL, LDLDIRECT in the last 72 hours. Thyroid Function Tests: No results for input(s): TSH, T4TOTAL, FREET4, T3FREE, THYROIDAB in the last 72 hours. Anemia Panel: No results for input(s): VITAMINB12, FOLATE, FERRITIN, TIBC, IRON, RETICCTPCT in the last 72 hours. Urine analysis:    Component Value Date/Time   COLORURINE YELLOW (A) 10/01/2020 [facility] (A) 10/01/2020 0713   APPEARANCEUR Clear 04/11/2012 0419  LABSPEC 1.017 10/01/2020 0713   LABSPEC 1.016 04/11/2012 0419   PHURINE 7.0 10/01/2020 0713   GLUCOSEU >=500 (A) 10/01/2020 0713   GLUCOSEU Negative 04/11/2012 0419   HGBUR SMALL (A) 10/01/2020 0713   BILIRUBINUR NEGATIVE 10/01/2020 0713   BILIRUBINUR Negative 04/11/2012 0419   KETONESUR 5 (A) 10/01/2020 0713   PROTEINUR 30 (A) 10/01/2020 0713   NITRITE NEGATIVE 10/01/2020 0713   LEUKOCYTESUR SMALL (A) 10/01/2020 0713   LEUKOCYTESUR Trace 04/11/2012 0419   Sepsis Labs: @LABRCNTIP(procalcitonin:4,lacticidven:4) ) Recent Results (from the past 240 hour(s))  Resp Panel by RT-PCR (Flu A&B, Covid) Nasopharyngeal Swab     Status: None   Collection Time: 10/01/20  5:33 AM   Specimen: Nasopharyngeal Swab; Nasopharyngeal(NP) swabs in vial transport medium  Result Value Ref Range Status   SARS Coronavirus 2 by RT PCR NEGATIVE NEGATIVE Final    Comment: (NOTE) SARS-CoV-2 target nucleic acids are NOT DETECTED.  The SARS-CoV-2 RNA is generally detectable in upper  respiratory specimens during the acute phase of infection. The lowest concentration of SARS-CoV-2 viral copies this assay can detect is 138 copies/mL. A negative result does not preclude SARS-Cov-2 infection and should not be used as the sole basis for treatment or other patient management decisions. A negative result may occur with  improper specimen collection/handling, submission of specimen other than nasopharyngeal swab, presence of viral mutation(s) within the areas targeted by this assay, and inadequate number of viral copies(<138 copies/mL). A negative result must be combined with clinical observations, patient history, and epidemiological information. The expected result is Negative.  Fact Sheet for Patients:  https://www.fda.gov/media/152166/download  Fact Sheet for Healthcare Providers:  https://www.fda.gov/media/152162/download  This test is no t yet approved or cleared by the [place] FDA and  has been authorized for detection and/or diagnosis of SARS-CoV-2 by FDA under an Emergency Use Authorization (EUA). This EUA will remain  in effect (meaning this test can be used) for the duration of the COVID-19 declaration under Section 564(b)(1) of the Act, 21 U.S.C.section 360bbb-3(b)(1), unless the authorization is terminated  or revoked sooner.       Influenza A by PCR NEGATIVE NEGATIVE Final   Influenza B by PCR NEGATIVE NEGATIVE Final    Comment: (NOTE) The Xpert Xpress SARS-CoV-2/FLU/RSV plus assay is intended as an aid in the diagnosis of influenza from Nasopharyngeal swab specimens and should not be used as a sole basis for treatment. Nasal washings and aspirates are unacceptable for Xpert Xpress SARS-CoV-2/FLU/RSV testing.  Fact Sheet for Patients: https://www.fda.gov/media/152166/download  Fact Sheet for Healthcare Providers: https://www.fda.gov/media/152162/download  This test is not yet approved or cleared by the [place] FDA and has been  authorized for detection and/or diagnosis of SARS-CoV-2 by FDA under an Emergency Use Authorization (EUA). This EUA will remain in effect (meaning this test can be used) for the duration of the COVID-19 declaration under Section 564(b)(1) of the Act, 21 U.S.C. section 360bbb-3(b)(1), unless the authorization is terminated or revoked.  Performed at [facility], [address] [place], [place] [address]   Blood Culture (routine x 2)     Status: None (Preliminary result)   Collection Time: 10/01/20  5:34 AM   Specimen: BLOOD  Result Value Ref Range Status   Specimen Description BLOOD BLOOD RIGHT FOREARM  Final   Special Requests   Final    BOTTLES DRAWN AEROBIC AND ANAEROBIC Blood Culture adequate volume   Culture   Final    NO GROWTH <12 HOURS Performed at [facility], [address] [place], [place] [address]    Report Status PENDING  Incomplete  Blood Culture (routine x 2)     Status: None (Preliminary result)   Collection Time: 10/01/20  5:34 AM   Specimen: BLOOD  Result Value Ref Range Status   Specimen Description BLOOD BLOOD LEFT FOREARM  Final   Special Requests   Final    BOTTLES DRAWN AEROBIC AND ANAEROBIC Blood Culture adequate volume   Culture   Final    NO GROWTH <12 HOURS Performed at [facility], [address], [place], [place] [address]    Report Status PENDING  Incomplete     Radiological Exams on Admission: CT ABDOMEN PELVIS W CONTRAST  Result Date: 10/01/2020 CLINICAL DATA:  50 year old male with massive scrotal swelling and possible superimposed left inguinal hernia. Right proximal thigh abscess. Diabetes and HIV. EXAM: CT ABDOMEN AND PELVIS WITH CONTRAST TECHNIQUE: Multidetector CT imaging of the abdomen and pelvis was performed using the standard protocol following bolus administration of intravenous contrast. CONTRAST:  136m OMNIPAQUE IOHEXOL 350 MG/ML SOLN COMPARISON:  Scrotal ultrasound with Doppler today reported  separately. CT Abdomen and Pelvis 10/26/2009. FINDINGS: Lower chest: Cardiac size at the upper limits of normal. Otherwise negative. Hepatobiliary: Negative liver and gallbladder. Pancreas: Negative. Spleen: Borderline splenomegaly. Adrenals/Urinary Tract: Normal adrenal glands. Left lower pole nephrolithiasis measures 6 mm. There is mild bilateral hydronephrosis and hydroureter. Both ureters are mildly dilated to the bladder. And the bladder is mildly distended and deviated to the right from mass effect related to the large left inguinal hernia detailed below. Estimated bladder volume is 270 mL. No perivesical stranding. No obstructing calculus. On delayed images renal contrast excretion is relatively symmetric. There is a small benign upper pole right renal cyst with simple fluid density. Stomach/Bowel: Negative rectum. Large left inguinal hernia containing the sigmoid colon is detailed below. Upstream descending colon is nondilated with retained stool. There is fluid in the right colon and transverse colon. Diminutive or absent appendix. Decompressed terminal ileum. No dilated small bowel. Decompressed stomach and duodenum. No free air. No free fluid. Vascular/Lymphatic: Asymmetric right external iliac chain lymphadenopathy, with nodes up to 12 mm short axis. Increased number of small right inguinal lymph nodes. And best seen on coronal image 53, and enlarged roughly 2.2 cm short axis node is intimately associated with roughly 7 cm of heterogeneous low-density phlegmon tracking distally into the medial left thigh from the node. There is faint rim enhancement near the node, but mostly this collection which encompasses 7 x 3.6 x 4.1 cm is indistinct and non organized. Surrounding subcutaneous inflammatory stranding. The collection is superficial to the anterior muscle layer. Superimposed proximal femoral arteries appear patent. No other lymphadenopathy in the abdomen or pelvis. Other major arterial structures in  the abdomen and pelvis are patent with minimal atherosclerosis. Portal venous system appears patent. Reproductive: Large left inguinal hernia. A roughly 18 cm hernia sac contains up to 30 cm of the distal descending and sigmoid colon and mesentery. But no associated bowel or mesenteric inflammation within the sac. Additional bowel details above. See also scrotal Doppler findings today. Other: No pelvic free fluid. Musculoskeletal: No acute osseous abnormality identified. IMPRESSION: 1. Large left inguinal hernia containing roughly 30 cm of the distal descending and sigmoid colon with mesentery. No evidence of incarceration. No bowel obstruction. There is associated mass effect on the urinary bladder (see #3). 2. Abnormal right inguinal and proximal thigh soft tissues most suggestive of a suppurative right inguinal lymph  node with subsequent 7.2 by 3.6 x 4.1 cm phlegmon tracking from the lower pole of the node out into the thigh. See coronal image 53. In conjunction with the ultrasound appearance today (reported separately) suspect this is a developing abscess although may not be drainable at this time. 3. Mild symmetric bilateral hydronephrosis and hydroureter is associated with urinary bladder mass effect and distension (270 mL). Consider bladder outlet obstruction. Left lower pole nephrolithiasis. Electronically Signed   By: Genevie Ann M.D.   On: 10/01/2020 07:43   DG Chest Port 1 View  Result Date: 10/01/2020 CLINICAL DATA:  50 year old male with possible sepsis. EXAM: PORTABLE CHEST 1 VIEW COMPARISON:  Chest radiographs 04/12/2012 and earlier. FINDINGS: Portable AP upright view at 0535 hours. Lung volumes and mediastinal contours are stable and within normal limits. Mild eventration of the right hemidiaphragm, normal variant. Visualized tracheal air column is within normal limits. Allowing for portable technique the lungs are clear. No pneumothorax or pleural effusion. No acute osseous abnormality identified.  Negative visible bowel gas. IMPRESSION: Negative portable chest. Electronically Signed   By: Genevie Ann M.D.   On: 10/01/2020 05:52   US SCROTUM W/DOPPLER  Result Date: 10/01/2020 CLINICAL DATA:  50 year old male with massive scrotal swelling and possible superimposed left inguinal hernia. Right proximal thigh abscess. Diabetes and HIV. EXAM: SCROTAL ULTRASOUND DOPPLER ULTRASOUND OF THE TESTICLES TECHNIQUE: Complete ultrasound examination of the testicles, epididymis, and other scrotal structures was performed. Color and spectral Doppler ultrasound were also utilized to evaluate blood flow to the testicles. COMPARISON:  CT Abdomen and Pelvis 0. 716 hours the same day reported separately FINDINGS: Right testicle Measurements: 4.0 x 2.1 x 2.5 cm. No mass or microlithiasis visualized. Left testicle Measurements: 3.8 x 2.0 x 3.1 cm. No mass or microlithiasis visualized. Right epididymis:  Normal in size and appearance. Left epididymis:  Normal in size and appearance. Hydrocele:  Small left-side simple appearing hydrocele. Varicocele:  None visualized. Pulsed Doppler interrogation of both testes demonstrates normal low resistance arterial and venous waveforms bilaterally. Other findings: Subcutaneous edema in the right inguinal region. An 1.1 cm short axis right inguinal node is visible (image 63), and seems contiguous with an irregular roughly 7.2 x 2.5 x 4.3 cm subcutaneous collection which is largely nonvascular (image 68) with overlying subcutaneous edema (image 61). Furthermore, there appears to be peristalsing bowel between the testes in the scrotum (image 39). IMPRESSION: 1. Negative for testicular mass or torsion. 2. Positive for an abnormal right inguinal lymph node which appears associated with a roughly 7.2 cm phlegmon or abscess, See CT Abdomen and Pelvis today reported separately. 3. Evidence of herniated bowel in the scrotum, see details also CT Abdomen and Pelvis. Electronically Signed   By: Genevie Ann M.D.    On: 10/01/2020 07:33     EKG: I have personally reviewed.  Sinus rhythm, QTC 429, LAD, poor IV progression  Assessment/Plan Principal Problem:   Inguinal abscess Active Problems:   Sepsis ([place])   Diabetes mellitus without complication ([place])   Left inguinal hernia   Hydronephrosis   Thrombocytopenia (HCC)   HIV (human immunodeficiency virus infection) ([place])   Sepsis due to right inguinal abscess: Patient meets criteria for sepsis with tachycardia with heart rate in 99 and tachypnea with RR 28.  Lactic acid is normal 1.2.  Currently hemodynamically stable.  No fever or leukocytosis.  Dr. Cleon Gustin of general surgery is consulted for I&D.  - Admitted to MedSurg bed as inpatient - Empiric antimicrobial treatment with  vancomycin and zosyn (patient received 1 dose of cefepime by ED) - PRN Zofran for nausea, fentanyl and Percocet for pain - Blood cultures x 2  - ESR and CRP - will get Procalcitonin - IVF: 1L of LR and 1L of NS bolus in ED, followed by 75 cc/h  Diabetes mellitus without complication (HCC): A!c 12.1 today, poorly controlled.  Patient is not taking medications currently -SSI -start lautus 5 units daily  Left inguinal hernia -Follow-up surgeon's recommendation  Hydronephrosis: Renal function okay -Consulted Dr. Brandon of urology  Thrombocytopenia (HCC): Platelet 109, likely due to ongoing infection -Follow-up with CBC -Check LDH and peripheral smear  HIV (human immunodeficiency virus infection) (HCC): Patient's not taking medications currently -Check viral load and CD4 level -need to f/u with ID    DVT ppx: SCD Code Status: Full code Family Communication: not done, no family member is at bed side.   Disposition Plan:  Anticipate discharge back to previous environment Consults called:  Dr. Brandon of urology and Dr. Rodenberg of general surgery Admission status and Level of care: Med-Surg:    as inpt        Status is: Inpatient  Remains inpatient  appropriate because:Inpatient level of care appropriate due to severity of illness  Dispo: The patient is from: Home              Anticipated d/c is to: Home              Patient currently is not medically stable to d/c.   Difficult to place patient No         Date of Service 10/01/2020    Xilin Niu [facility]   If 7PM-7AM, please contact night-coverage [email or web address] 10/01/2020, 6:22 PM  

## 2020-10-01 NOTE — ED Triage Notes (Signed)
Via spanish interpreter, the pt states pain in the right groin with swelling. Has "felt feverish" and reports cramping.  On assessment, pt has large area of redness and swelling to the inner right thigh area, firm/ hot to touch. Redness extends down through the thigh.

## 2020-10-02 ENCOUNTER — Encounter: Payer: Self-pay | Admitting: Surgery

## 2020-10-02 DIAGNOSIS — L0291 Cutaneous abscess, unspecified: Secondary | ICD-10-CM

## 2020-10-02 LAB — BASIC METABOLIC PANEL
Anion gap: 5 (ref 5–15)
BUN: 21 mg/dL — ABNORMAL HIGH (ref 6–20)
CO2: 22 mmol/L (ref 22–32)
Calcium: 7.8 mg/dL — ABNORMAL LOW (ref 8.9–10.3)
Chloride: 105 mmol/L (ref 98–111)
Creatinine, Ser: 0.97 mg/dL (ref 0.61–1.24)
GFR, Estimated: >60 mL/min (ref >60)
Glucose, Bld: 265 mg/dL — ABNORMAL HIGH (ref 70–99)
Potassium: 3.6 mmol/L (ref 3.5–5.1)
Sodium: 132 mmol/L — ABNORMAL LOW (ref 135–145)

## 2020-10-02 LAB — URINE CULTURE: Culture: 40000 — AB

## 2020-10-02 LAB — GLUCOSE, CAPILLARY
Glucose-Capillary: 252 mg/dL — ABNORMAL HIGH (ref 70–99)
Glucose-Capillary: 286 mg/dL — ABNORMAL HIGH (ref 70–99)
Glucose-Capillary: 262 mg/dL — ABNORMAL HIGH (ref 70–99)
Glucose-Capillary: 185 mg/dL — ABNORMAL HIGH (ref 70–99)

## 2020-10-02 LAB — CBC
HCT: 30 % — ABNORMAL LOW (ref 39.0–52.0)
Hemoglobin: 10.6 g/dL — ABNORMAL LOW (ref 13.0–17.0)
MCH: 29.5 pg (ref 26.0–34.0)
MCHC: 35.3 g/dL (ref 30.0–36.0)
MCV: 83.6 fL (ref 80.0–100.0)
Platelets: 103 10*3/uL — ABNORMAL LOW (ref 150–400)
RBC: 3.59 MIL/uL — ABNORMAL LOW (ref 4.22–5.81)
RDW: 12.2 % (ref 11.5–15.5)
WBC: 4.6 10*3/uL (ref 4.0–10.5)
nRBC: 0 % (ref 0.0–0.2)

## 2020-10-02 LAB — PROCALCITONIN: Procalcitonin: 0.2 ng/mL

## 2020-10-02 MED ORDER — PIPERACILLIN-TAZOBACTAM 3.375 G IVPB
3.3750 g | Freq: Three times a day (TID) | INTRAVENOUS | Status: AC
  Administered 2020-10-02 (×2): 3.375 g via INTRAVENOUS
  Filled 2020-10-02 (×2): qty 50

## 2020-10-02 MED ORDER — KETOROLAC TROMETHAMINE 15 MG/ML IJ SOLN
15.0000 mg | Freq: Three times a day (TID) | INTRAMUSCULAR | Status: DC | PRN
  Administered 2020-10-02 – 2020-10-03 (×2): 15 mg via INTRAVENOUS
  Filled 2020-10-02 (×2): qty 1

## 2020-10-02 MED ORDER — LINEZOLID 600 MG/300ML IV SOLN
600.0000 mg | Freq: Two times a day (BID) | INTRAVENOUS | Status: DC
  Administered 2020-10-02 – 2020-10-03 (×2): 600 mg via INTRAVENOUS
  Filled 2020-10-02 (×3): qty 300

## 2020-10-02 MED ORDER — VANCOMYCIN HCL 750 MG/150ML IV SOLN
750.0000 mg | Freq: Two times a day (BID) | INTRAVENOUS | Status: DC
  Filled 2020-10-02 (×2): qty 150

## 2020-10-02 MED ORDER — CHLORHEXIDINE GLUCONATE CLOTH 2 % EX PADS
6.0000 | MEDICATED_PAD | Freq: Every day | CUTANEOUS | Status: DC
  Administered 2020-10-02: 6 via TOPICAL

## 2020-10-02 MED ORDER — METFORMIN HCL 500 MG PO TABS
500.0000 mg | ORAL_TABLET | Freq: Two times a day (BID) | ORAL | Status: DC
  Administered 2020-10-03: 500 mg via ORAL
  Filled 2020-10-02: qty 1

## 2020-10-02 MED ORDER — SULFAMETHOXAZOLE-TRIMETHOPRIM 800-160 MG PO TABS
1.0000 | ORAL_TABLET | Freq: Two times a day (BID) | ORAL | Status: DC
  Administered 2020-10-02: 1 via ORAL
  Filled 2020-10-02 (×2): qty 1

## 2020-10-02 NOTE — Progress Notes (Signed)
[facility]  - [place] [facility]   PATIENT NAME: [name]    MR#:  [id number]  DATE OF BIRTH:  [birth year]  SUBJECTIVE:  five video Spanish interpreter. Patient came in with right groin pain. Found to have an abscess in the inguinal region underwent incision and drainage. Overall doing well. Tolerating PO diet. No family at bedside. Dressing changes with packing done by RN  REVIEW OF SYSTEMS:   Review of Systems  Constitutional:  Negative for chills, fever and weight loss.  HENT:  Negative for ear discharge, ear pain and nosebleeds.   Eyes:  Negative for blurred vision, pain and discharge.  Respiratory:  Negative for sputum production, shortness of breath, wheezing and stridor.   Cardiovascular:  Negative for chest pain, palpitations, orthopnea and PND.  Gastrointestinal:  Negative for abdominal pain, diarrhea, nausea and vomiting.  Genitourinary:  Negative for frequency and urgency.  Musculoskeletal:  Negative for back pain and joint pain.  Neurological:  Positive for weakness. Negative for sensory change, speech change and focal weakness.  Psychiatric/Behavioral:  Negative for depression and hallucinations. The patient is not nervous/anxious.   Tolerating Diet:yes Tolerating PT: ambulatory  DRUG ALLERGIES:   Allergies  Allergen Reactions   Morphine And Related Rash    VITALS:  Blood pressure 124/76, pulse 74, temperature 98.1 F (36.7 C), temperature source Oral, resp. rate 18, height 5\' 2"  (1.575 m), weight 99.8 kg, SpO2 100 %.  PHYSICAL EXAMINATION:   Physical Exam  GENERAL:  50 y.o.-year-old patient lying in the bed with no acute distress.  LUNGS: Normal breath sounds bilaterally, no wheezing, rales, rhonchi. No use of accessory muscles of respiration.  CARDIOVASCULAR: S1, S2 normal. No murmurs, rubs, or gallops.  ABDOMEN: Soft, nontender, nondistended. Bowel sounds present. No organomegaly or mass.  Right groin packing + EXTREMITIES: No  cyanosis, clubbing or edema b/l.   44   PSYCHIATRIC:  patient is alert and oriented x 3.  SKIN: No obvious rash, lesion, or ulcer.   LABORATORY PANEL:  CBC Recent Labs  Lab 10/02/20 0525  WBC 4.6  HGB 10.6*  HCT 30.0*  PLT 103*    Chemistries  Recent Labs  Lab 10/01/20 0533 10/02/20 0525  NA 127* 132*  K 3.6 3.6  CL 96* 105  CO2 20* 22  GLUCOSE 407* 265*  BUN 21* 21*  CREATININE 0.98 0.97  CALCIUM 8.2* 7.8*  AST 17  --   ALT 14  --   ALKPHOS 127*  --   BILITOT 1.2  --    Cardiac Enzymes No results for input(s): TROPONINI in the last 168 hours. RADIOLOGY:  CT ABDOMEN PELVIS W CONTRAST  Result Date: 10/01/2020 CLINICAL DATA:  50 year old male with massive scrotal swelling and possible superimposed left inguinal hernia. Right proximal thigh abscess. Diabetes and HIV. EXAM: CT ABDOMEN AND PELVIS WITH CONTRAST TECHNIQUE: Multidetector CT imaging of the abdomen and pelvis was performed using the standard protocol following bolus administration of intravenous contrast. CONTRAST:  44 OMNIPAQUE IOHEXOL 350 MG/ML SOLN COMPARISON:  Scrotal ultrasound with Doppler today reported separately. CT Abdomen and Pelvis 10/26/2009. FINDINGS: Lower chest: Cardiac size at the upper limits of normal. Otherwise negative. Hepatobiliary: Negative liver and gallbladder. Pancreas: Negative. Spleen: Borderline splenomegaly. Adrenals/Urinary Tract: Normal adrenal glands. Left lower pole nephrolithiasis measures 6 mm. There is mild bilateral hydronephrosis and hydroureter. Both ureters are mildly dilated to the bladder. And the bladder is mildly distended and deviated to the right from mass effect related to the  large left inguinal hernia detailed below. Estimated bladder volume is 270 mL. No perivesical stranding. No obstructing calculus. On delayed images renal contrast excretion is relatively symmetric. There is a small benign upper pole right renal cyst with simple fluid density. Stomach/Bowel: Negative  rectum. Large left inguinal hernia containing the sigmoid colon is detailed below. Upstream descending colon is nondilated with retained stool. There is fluid in the right colon and transverse colon. Diminutive or absent appendix. Decompressed terminal ileum. No dilated small bowel. Decompressed stomach and duodenum. No free air. No free fluid. Vascular/Lymphatic: Asymmetric right external iliac chain lymphadenopathy, with nodes up to 12 mm short axis. Increased number of small right inguinal lymph nodes. And best seen on coronal image 53, and enlarged roughly 2.2 cm short axis node is intimately associated with roughly 7 cm of heterogeneous low-density phlegmon tracking distally into the medial left thigh from the node. There is faint rim enhancement near the node, but mostly this collection which encompasses 7 x 3.6 x 4.1 cm is indistinct and non organized. Surrounding subcutaneous inflammatory stranding. The collection is superficial to the anterior muscle layer. Superimposed proximal femoral arteries appear patent. No other lymphadenopathy in the abdomen or pelvis. Other major arterial structures in the abdomen and pelvis are patent with minimal atherosclerosis. Portal venous system appears patent. Reproductive: Large left inguinal hernia. A roughly 18 cm hernia sac contains up to 30 cm of the distal descending and sigmoid colon and mesentery. But no associated bowel or mesenteric inflammation within the sac. Additional bowel details above. See also scrotal Doppler findings today. Other: No pelvic free fluid. Musculoskeletal: No acute osseous abnormality identified. IMPRESSION: 1. Large left inguinal hernia containing roughly 30 cm of the distal descending and sigmoid colon with mesentery. No evidence of incarceration. No bowel obstruction. There is associated mass effect on the urinary bladder (see #3). 2. Abnormal right inguinal and proximal thigh soft tissues most suggestive of a suppurative right inguinal  lymph node with subsequent 7.2 by 3.6 x 4.1 cm phlegmon tracking from the lower pole of the node out into the thigh. See coronal image 53. In conjunction with the ultrasound appearance today (reported separately) suspect this is a developing abscess although may not be drainable at this time. 3. Mild symmetric bilateral hydronephrosis and hydroureter is associated with urinary bladder mass effect and distension (270 mL). Consider bladder outlet obstruction. Left lower pole nephrolithiasis. Electronically Signed   By: Odessa FlemingH  Hall M.D.   On: 10/01/2020 07:43   DG Chest Port 1 View  Result Date: 10/01/2020 CLINICAL DATA:  50 year old male with possible sepsis. EXAM: PORTABLE CHEST 1 VIEW COMPARISON:  Chest radiographs 04/12/2012 and earlier. FINDINGS: Portable AP upright view at 0535 hours. Lung volumes and mediastinal contours are stable and within normal limits. Mild eventration of the right hemidiaphragm, normal variant. Visualized tracheal air column is within normal limits. Allowing for portable technique the lungs are clear. No pneumothorax or pleural effusion. No acute osseous abnormality identified. Negative visible bowel gas. IMPRESSION: Negative portable chest. Electronically Signed   By: Odessa FlemingH  Hall M.D.   On: 10/01/2020 05:52   US SCROTUM W/DOPPLER  Result Date: 10/01/2020 CLINICAL DATA:  50 year old male with massive scrotal swelling and possible superimposed left inguinal hernia. Right proximal thigh abscess. Diabetes and HIV. EXAM: SCROTAL ULTRASOUND DOPPLER ULTRASOUND OF THE TESTICLES TECHNIQUE: Complete ultrasound examination of the testicles, epididymis, and other scrotal structures was performed. Color and spectral Doppler ultrasound were also utilized to evaluate blood flow to the testicles. COMPARISON:  CT Abdomen and Pelvis 0. 716 hours the same day reported separately FINDINGS: Right testicle Measurements: 4.0 x 2.1 x 2.5 cm. No mass or microlithiasis visualized. Left testicle Measurements: 3.8 x  2.0 x 3.1 cm. No mass or microlithiasis visualized. Right epididymis:  Normal in size and appearance. Left epididymis:  Normal in size and appearance. Hydrocele:  Small left-side simple appearing hydrocele. Varicocele:  None visualized. Pulsed Doppler interrogation of both testes demonstrates normal low resistance arterial and venous waveforms bilaterally. Other findings: Subcutaneous edema in the right inguinal region. An 1.1 cm short axis right inguinal node is visible (image 63), and seems contiguous with an irregular roughly 7.2 x 2.5 x 4.3 cm subcutaneous collection which is largely nonvascular (image 68) with overlying subcutaneous edema (image 61). Furthermore, there appears to be peristalsing bowel between the testes in the scrotum (image 39). IMPRESSION: 1. Negative for testicular mass or torsion. 2. Positive for an abnormal right inguinal lymph node which appears associated with a roughly 7.2 cm phlegmon or abscess, See CT Abdomen and Pelvis today reported separately. 3. Evidence of herniated bowel in the scrotum, see details also CT Abdomen and Pelvis. Electronically Signed   By: Odessa Fleming M.D.   On: 10/01/2020 07:33   ASSESSMENT AND PLAN:  Revanth Neidig is a 50 y.o. male with medical history significant of self-reported history of diabetes and HIV, not taking medications for several years, left inguinal hernia, kidney stone, who presents with pain in right inguinal area.  Sepsis due to right inguinal abscess: Patient meets criteria for sepsis with tachycardia with heart rate in 99 and tachypnea with RR 28.  Lactic acid is normal 1.2.  Currently hemodynamically stable.  No fever or leukocytosis.   -- Status post incision drainage per surgery.- Admitted to MedSurg bed as inpatient -- PRN Zofran for nausea, fentanyl and Percocet for pain -- Blood cultures x 2--negative -- continue IV Zosyn. Will add Bactrim - -Pro-calcitonin 0.28   Diabetes mellitus without complication (HCC): uncontrolled,  hyperglycemia --A!c 12.1 today, poorly controlled.  Patient is not taking medications currently -SSI -start lautus 5 units daily -- start patient on metformin 500 BID. Will increase dose according to sugars. -- diabetes coordinator consult   Left inguinal hernia -Follow-up surgeon's recommendation   chronic hydronephrosis: Renal function okay -Consulted Dr. Apolinar Junes of urology-- appreciated. No urology intervention needed at present. Patient will follow-up as outpatient. Will discontinue Foley. -- good urine output   Thrombocytopenia (HCC): Platelet 109, likely due to ongoing infection -Follow-up with CBC -Check LDH and peripheral smear   HIV (human immunodeficiency virus infection) (HCC): Patient's not taking medications currently -Check viral load and CD4 level -with infectious disease Dr. Joylene Draft will see patient       DVT ppx: SCD Code Status: Full code Family Communication: no family member is at bed side.   Disposition Plan:  Anticipate discharge back to previous environment Consults called:  Dr. Apolinar Junes of urology and Dr. Claudine Mouton of general surgery Admission status and Level of care: Med-Surg:    as inpt     Procedures: I and D       TOTAL TIME TAKING CARE OF THIS PATIENT: 30 minutes.  >50% time spent on counselling and coordination of care  Note: This dictation was prepared with Dragon dictation along with smaller phrase technology. Any transcriptional errors that result from this process are unintentional.  Enedina Finner M.D    [facility]   CC: Primary care physician; Pcp, No Patient ID: Crissie Sickles,  male   DOB: [birth year], 50 y.o.   MRN: [id number]

## 2020-10-02 NOTE — Plan of Care (Signed)

## 2020-10-02 NOTE — Consult Note (Addendum)
NAME: Zachary SicklesJose Fritz  DOB: Sep 07, 1970  MRN: [id number]  Date/Time: 10/02/2020 3:44 PM  REQUESTING PROVIDER: Dr.PAtel Subjective:  REASON FOR CONSULT: HIV disease ?pt speaks spanish- history obtained thru video interpreter. Pt has another medical records under Zachary ChalkJose A Fritz MRN [id number]  [name] is a 50 y.o. male with a history of HIV, DM presented to the hospital with 3 days history of painful swelling rt inner and upper thigh- he denied trauma, animal bites, tick bites Ct scan showed an abscess in the rt thigh thought to be an infected femoral node. He also had a large left inguinal hernia He underwent I/D of the rt femoral abscess. He was on zosyn/vanco and the latter changed to  bactrim . I am asked to see the patient for positive HIV test Pt has known HIV- diagnosed 8-9 yrs ago he says-( [facility] had HIV VL done)  His male partner and mother of his children  had HIV and she died in [place] . [facility] and was last seen there July 2020- at that time he was on Descovy, prezista and Norvir- His last Vl from 2019 was 40 and cd4 > 700 He was lost to follow up since the last visit in July- He had refills in Aug 2020- [facility] counselor [phone]   was trying to reach him Aug 2021 but his phone number was not correct- pt says they moved the clinic and he did not have their new address. He has not taken any meds since 2021 [facility] he had CMV duodenitis Nadir Cd4 count on 05/11/2011 was 92   Past Medical History:  Diagnosis Date   Diabetes mellitus without complication (HCC)    HIV (human immunodeficiency virus infection) (HCC)    Kidney stone    Left inguinal hernia    CMV duodenitis  Past Surgical History:  Procedure Laterality Date   APPENDECTOMY     INCISION AND DRAINAGE ABSCESS Right 10/01/2020   Procedure: INCISION AND DRAINAGE ABSCESS;  Surgeon: Campbell Lernerodenberg, Denny, MD;  Location: [facility] ORS;  Service: General;  Laterality: Right;    kidney stone sugery Left     Social History   Socioeconomic History   Marital status: Married    Spouse name: Not on file   Number of children: Not on file   Years of education: Not on file   Highest education level: Not on file  Occupational History   Not on file  Tobacco Use   Smoking status: Never   Smokeless tobacco: Never  Substance and Sexual Activity   Alcohol use: Not Currently   Drug use: Never   Sexual activity: Not on file  Other Topics Concern   Not on file  Social History Narrative   Not on file   Social Determinants of Health   Financial Resource Strain: Not on file  Food Insecurity: Not on file  Transportation Needs: Not on file  Physical Activity: Not on file  Stress: Not on file  Social Connections: Not on file  Intimate Partner Violence: Not on file    Family History  Problem Relation Age of Onset   Diabetes Mellitus II Mother    Allergies  Allergen Reactions   Morphine And Related Rash   I? Current Facility-Administered Medications  Medication Dose Route Frequency Provider Last Rate Last Admin   0.9 %  sodium chloride infusion   Intravenous Continuous Campbell Lernerodenberg, Denny, MD   Stopped at 10/01/20 2307  0.9 %  sodium chloride infusion   Intravenous PRN Lorretta Harp, MD 10 mL/hr at 10/02/20 0827 Infusion Verify at 10/02/20 0827   acetaminophen (TYLENOL) tablet 650 mg  650 mg Oral Q6H PRN Campbell Lerner, MD       Chlorhexidine Gluconate Cloth 2 % PADS 6 each  6 each Topical Daily Lorretta Harp, MD   6 each at 10/02/20 1053   hydrALAZINE (APRESOLINE) injection 5 mg  5 mg Intravenous Q2H PRN Campbell Lerner, MD       insulin aspart (novoLOG) injection 0-5 Units  0-5 Units Subcutaneous QHS Campbell Lerner, MD   3 Units at 10/01/20 2242   insulin aspart (novoLOG) injection 0-9 Units  0-9 Units Subcutaneous TID WC Campbell Lerner, MD   5 Units at 10/02/20 1138   insulin glargine (LANTUS) injection 5 Units  5 Units Subcutaneous Daily Campbell Lerner, MD    5 Units at 10/02/20 1761   ketorolac (TORADOL) 15 MG/ML injection 15 mg  15 mg Intravenous Q8H PRN Enedina Finner, MD       Melene Muller ON 10/03/2020] metFORMIN (GLUCOPHAGE) tablet 500 mg  500 mg Oral BID WC Enedina Finner, MD       ondansetron [facility]) injection 4 mg  4 mg Intravenous Q8H PRN Campbell Lerner, MD       oxyCODONE-acetaminophen (PERCOCET/ROXICET) 5-325 MG per tablet 1 tablet  1 tablet Oral Q4H PRN Campbell Lerner, MD   1 tablet at 10/02/20 1137   piperacillin-tazobactam (ZOSYN) IVPB 3.375 g  3.375 g Intravenous Q8H Lowella Bandy, RPH 12.5 mL/hr at 10/02/20 1443 3.375 g at 10/02/20 1443   sulfamethoxazole-trimethoprim (BACTRIM DS) 800-160 MG per tablet 1 tablet  1 tablet Oral Q12H Enedina Finner, MD   1 tablet at 10/02/20 1137     Abtx:  Anti-infectives (From admission, onward)    Start     Dose/Rate Route Frequency Ordered Stop   10/02/20 1400  piperacillin-tazobactam (ZOSYN) IVPB 3.375 g        3.375 g 12.5 mL/hr over 240 Minutes Intravenous Every 8 hours 10/02/20 1104     10/02/20 1200  vancomycin (VANCOREADY) IVPB 750 mg/150 mL  Status:  Discontinued        750 mg 150 mL/hr over 60 Minutes Intravenous Every 12 hours 10/02/20 0823 10/02/20 1027   10/02/20 1200  sulfamethoxazole-trimethoprim (BACTRIM DS) 800-160 MG per tablet 1 tablet        1 tablet Oral Every 12 hours 10/02/20 1045     10/01/20 1800  vancomycin (VANCOREADY) IVPB 1500 mg/300 mL  Status:  Discontinued        1,500 mg 150 mL/hr over 120 Minutes Intravenous Every 24 hours 10/01/20 1342 10/02/20 0823   10/01/20 1400  piperacillin-tazobactam (ZOSYN) IVPB 3.375 g  Status:  Discontinued        3.375 g 12.5 mL/hr over 240 Minutes Intravenous Every 8 hours 10/01/20 1041 10/02/20 1044   10/01/20 0530  ceFEPIme (MAXIPIME) 2 g in sodium chloride 0.9 % 100 mL IVPB        2 g 200 mL/hr over 30 Minutes Intravenous  Once 10/01/20 0528 10/01/20 0655   10/01/20 0530  vancomycin (VANCOREADY) IVPB 1250 mg/250 mL        1,250  mg 166.7 mL/hr over 90 Minutes Intravenous  Once 10/01/20 0528 10/01/20 0751       REVIEW OF SYSTEMS:  Const: fever, negative chills, positive weight loss Eyes: negative diplopia or visual changes, negative eye pain ENT: negative coryza, negative sore  throat Resp: negative cough, hemoptysis, dyspnea Cards: negative for chest pain, palpitations, lower extremity edema GU: negative for frequency, dysuria and hematuria GI: Negative for abdominal pain, diarrhea, bleeding, constipation Skin: negative for rash and pruritus Heme: negative for easy bruising and gum/nose bleeding MS: fatigue and weakness Neurolo:negative for headaches, dizziness, vertigo, memory problems  Psych: negative for feelings of anxiety, depression  Endocrine: poorly controlled  diabetes Allergy/Immunology- as above Objective:  VITALS:  BP 126/81 (BP Location: Left Arm)   Pulse 75   Temp 98.8 F (37.1 C)   Resp 18   Ht 5\' 2"  (1.575 m)   Wt 99.8 kg   SpO2 100%   BMI 40.24 kg/m  PHYSICAL EXAM:  General: Alert, cooperative, no distress at rest appears stated age.  Head: Normocephalic, without obvious abnormality, atraumatic. Eyes: Conjunctivae clear, anicteric sclerae. Pupils are equal ENT Nares normal. No drainage or sinus tenderness. Lips, mucosa, and tongue normal. No Thrush Neck: Supple, symmetrical, no adenopathy, thyroid: non tender no carotid bruit and no JVD. Back: No CVA tenderness. Lungs: Clear to auscultation bilaterally. No Wheezing or Rhonchi. No rales. Heart: Regular rate and rhythm, no murmur, rub or gallop. Abdomen: Soft, non-tender,not distended. Bowel sounds normal. No masses Extremities: rt thigh- upper and inner- swollen, indurated, I/D site covered with dressing- erythematous around Severe scrotal swelling  Skin: No rashes or lesions. Or bruising Lymph: Cervical, supraclavicular normal. Neurologic: Grossly non-focal Pertinent Labs Lab Results CBC    Component Value Date/Time    WBC 4.6 10/02/2020 0525   RBC 3.59 (L) 10/02/2020 0525   HGB 10.6 (L) 10/02/2020 0525   HGB 14.5 04/12/2012 0544   HCT 30.0 (L) 10/02/2020 0525   HCT 40.8 04/12/2012 0544   PLT 103 (L) 10/02/2020 0525   PLT 198 04/12/2012 0544   MCV 83.6 10/02/2020 0525   MCV 93 04/12/2012 0544   MCH 29.5 10/02/2020 0525   MCHC 35.3 10/02/2020 0525   RDW 12.2 10/02/2020 0525   RDW 12.9 04/12/2012 0544   LYMPHSABS 0.8 10/01/2020 0533   LYMPHSABS 0.9 (L) 04/12/2012 0544   MONOABS 1.0 10/01/2020 0533   MONOABS 0.2 04/12/2012 0544   EOSABS 0.1 10/01/2020 0533   EOSABS 0.0 04/12/2012 0544   BASOSABS 0.0 10/01/2020 0533   BASOSABS 0.0 04/12/2012 0544    CMP Latest Ref Rng & Units 10/02/2020 10/01/2020 04/12/2012  Glucose 70 - 99 mg/dL 04/14/2012) 527(P) 824(M)  BUN 6 - 20 mg/dL 353(I) 14(E) 17  Creatinine 0.61 - 1.24 mg/dL 31(V 4.00 8.67  Sodium 135 - 145 mmol/L 132(L) 127(L) 133(L)  Potassium 3.5 - 5.1 mmol/L 3.6 3.6 4.3  Chloride 98 - 111 mmol/L 105 96(L) 102  CO2 22 - 32 mmol/L 22 20(L) 20(L)  Calcium 8.9 - 10.3 mg/dL 7.8(L) 8.2(L) 8.4(L)  Total Protein 6.5 - 8.1 g/dL - 7.5 -  Total Bilirubin 0.3 - 1.2 mg/dL - 1.2 -  Alkaline Phos 38 - 126 U/L - 127(H) -  AST 15 - 41 U/L - 17 -  ALT 0 - 44 U/L - 14 -      Microbiology: Recent Results (from the past 240 hour(s))  Resp Panel by RT-PCR (Flu A&B, Covid) Nasopharyngeal Swab     Status: None   Collection Time: 10/01/20  5:33 AM   Specimen: Nasopharyngeal Swab; Nasopharyngeal(NP) swabs in vial transport medium  Result Value Ref Range Status   SARS Coronavirus 2 by RT PCR NEGATIVE NEGATIVE Final    Comment: (NOTE) SARS-CoV-2 target nucleic acids are NOT  DETECTED.  The SARS-CoV-2 RNA is generally detectable in upper respiratory specimens during the acute phase of infection. The lowest concentration of SARS-CoV-2 viral copies this assay can detect is 138 copies/mL. A negative result does not preclude SARS-Cov-2 infection and should not be used as  the sole basis for treatment or other patient management decisions. A negative result may occur with  improper specimen collection/handling, submission of specimen other than nasopharyngeal swab, presence of viral mutation(s) within the areas targeted by this assay, and inadequate number of viral copies(<138 copies/mL). A negative result must be combined with clinical observations, patient history, and epidemiological information. The expected result is Negative.  Fact Sheet for Patients:  BloggerCourse.com  Fact Sheet for Healthcare Providers:  SeriousBroker.it  This test is no t yet approved or cleared by the [place] FDA and  has been authorized for detection and/or diagnosis of SARS-CoV-2 by FDA under an Emergency Use Authorization (EUA). This EUA will remain  in effect (meaning this test can be used) for the duration of the COVID-19 declaration under Section 564(b)(1) of the Act, 21 U.S.C.section 360bbb-3(b)(1), unless the authorization is terminated  or revoked sooner.       Influenza A by PCR NEGATIVE NEGATIVE Final   Influenza B by PCR NEGATIVE NEGATIVE Final    Comment: (NOTE) The Xpert Xpress SARS-CoV-2/FLU/RSV plus assay is intended as an aid in the diagnosis of influenza from Nasopharyngeal swab specimens and should not be used as a sole basis for treatment. Nasal washings and aspirates are unacceptable for Xpert Xpress SARS-CoV-2/FLU/RSV testing.  Fact Sheet for Patients: BloggerCourse.com  Fact Sheet for Healthcare Providers: SeriousBroker.it  This test is not yet approved or cleared by the [place] FDA and has been authorized for detection and/or diagnosis of SARS-CoV-2 by FDA under an Emergency Use Authorization (EUA). This EUA will remain in effect (meaning this test can be used) for the duration of the COVID-19 declaration under Section 564(b)(1) of  the Act, 21 U.S.C. section 360bbb-3(b)(1), unless the authorization is terminated or revoked.  Performed at [facility], [address] [place], [place] [address]   Blood Culture (routine x 2)     Status: None (Preliminary result)   Collection Time: 10/01/20  5:34 AM   Specimen: BLOOD  Result Value Ref Range Status   Specimen Description BLOOD BLOOD RIGHT FOREARM  Final   Special Requests   Final    BOTTLES DRAWN AEROBIC AND ANAEROBIC Blood Culture adequate volume   Culture   Final    NO GROWTH 1 DAY Performed at [facility], [address] [place], [place] [address]    Report Status PENDING  Incomplete  Blood Culture (routine x 2)     Status: None (Preliminary result)   Collection Time: 10/01/20  5:34 AM   Specimen: BLOOD  Result Value Ref Range Status   Specimen Description BLOOD BLOOD LEFT FOREARM  Final   Special Requests   Final    BOTTLES DRAWN AEROBIC AND ANAEROBIC Blood Culture adequate volume   Culture   Final    NO GROWTH 1 DAY Performed at [facility], [address], [place], [place] [address]    Report Status PENDING  Incomplete  Urine culture     Status: Abnormal   Collection Time: 10/01/20  7:13 AM   Specimen: In/Out Cath Urine  Result Value Ref Range Status   Specimen Description   Final    IN/OUT CATH URINE Performed at [facility], [address]  [address] [place], [place] [address]    Special Requests   Final    NONE Performed at [facility], [address] [place], [place] [address]    Culture 40,000 COLONIES/mL YEAST (A)  Final   Report Status 10/02/2020 FINAL  Final  Aerobic/Anaerobic Culture w Gram Stain (surgical/deep wound)     Status: None (Preliminary result)   Collection Time: 10/01/20  4:30 PM   Specimen: PATH Lymph node biopsy; Tissue  Result Value Ref Range Status   Specimen Description   Final    TISSUE Performed at [facility], [address] [place], [place] [address]     Special Requests   Final    RIGHT FEMORAL LYMPH NODE Performed at [facility], [address] [place], [place] [address]    Gram Stain   Final    NO WBC SEEN FEW GRAM POSITIVE COCCI IN PAIRS Performed at [facility], [address], [place], [place] [address]    Culture PENDING  Incomplete   Report Status PENDING  Incomplete    IMAGING RESULTS: I have personally reviewed the films ? Impression/Recommendation Rt femoral abscess- lymphadenitis with abscess formation VS abscess- MRSA a concern- gram stain - Cocci in apirs Currently on zosyn and bactrim- DC both and change to linezolid. May add unasyn -Will discuss with surgeon regarding anatomy  DM- poorly controlled? ? AIDS- [facility] records shows labs as far back as 2012- At one time he had cd4 of 60, last in 2019 was > 700. VL was 40. He was on decovy, prezista and norvir then- Off meds since 2021- not been in care [facility] was planning to switch him to [place]- They were trying to reach him thru [facility] counselor but his number was not correct. Will call them tomorrow to engage him back in care- he wants to go to [facility] as he says he will be moving there with his cousin sister. Once I speak to them will put him on Biktarvy Vl/Cd4 sent Keep a close eye on OIs  Thrombocytopenia- could be from HIV H/o nephrolithiasis  H/O b/l hydronephrosis and hydroureter- urology on board- no intervention as this is chronic and kidney function normal  Anemia  R/o Ois causing the rt thigh infection  Both medical records will have to be merged  ___________________________________________________ Discussed with patient, requesting provider Note:  This document was prepared using Dragon voice recognition software and may include unintentional dictation errors.

## 2020-10-02 NOTE — Progress Notes (Signed)
[facility] SURGICAL PROGRESS NOTE  Hospital Day(s): 1.   Post op day(s): 1 Day Post-Op.   Interval History:  Patient seen and examined No acute events or new complaints overnight.  Patient reports he is feeling better; smiling in bed this morning  No fever chills He remains without leukocytosis; 4.6K Renal function remains normal; sCr - 0.97; UO - 1.7L Hyperglycemia improved; down to 265 Cx from OR in process; on Zosyn Tolerating diet  Vital signs in last 24 hours: [min-max] current  Temp:  [98.6 F (37 C)-100 F (37.8 C)] 98.6 F (37 C) (07/14 0438) Pulse Rate:  [72-97] 72 (07/14 0438) Resp:  [14-27] 16 (07/14 0438) BP: (122-163)/(72-89) 125/78 (07/14 0438) SpO2:  [99 %-100 %] 100 % (07/14 0438) Weight:  [99.8 kg] 99.8 kg (07/13 1237)     Height: 5\' 2"  (157.5 cm) Weight: 99.8 kg BMI (Calculated): 40.23   Intake/Output last 2 shifts:  07/13 0701 - 07/14 0700 In: 1554 [I.V.:1204; IV Piggyback:350] Out: 1710 [Urine:1700; Blood:10]   Physical Exam:  Constitutional: alert, cooperative and no distress  Respiratory: breathing non-labored at rest  Cardiovascular: regular rate and sinus rhythm  Integumentary: I&D to right groin; dressing present. Large chronic left inguinal hernia; no evidence of incarceration or strangulation  Labs:  CBC Latest Ref Rng & Units 10/02/2020 10/01/2020 04/12/2012  WBC 4.0 - 10.5 K/uL 4.6 9.0 7.7  Hemoglobin 13.0 - 17.0 g/dL 10.6(L) 12.7(L) 14.5  Hematocrit 39.0 - 52.0 % 30.0(L) 34.7(L) 40.8  Platelets 150 - 400 K/uL 103(L) 109(L) 198   CMP Latest Ref Rng & Units 10/02/2020 10/01/2020 04/12/2012  Glucose 70 - 99 mg/dL 04/14/2012) 010(X) 323(F)  BUN 6 - 20 mg/dL 573(U) 20(U) 17  Creatinine 0.61 - 1.24 mg/dL 54(Y 7.06 2.37  Sodium 135 - 145 mmol/L 132(L) 127(L) 133(L)  Potassium 3.5 - 5.1 mmol/L 3.6 3.6 4.3  Chloride 98 - 111 mmol/L 105 96(L) 102  CO2 22 - 32 mmol/L 22 20(L) 20(L)  Calcium 8.9 - 10.3 mg/dL 7.8(L) 8.2(L) 8.4(L)  Total  Protein 6.5 - 8.1 g/dL - 7.5 -  Total Bilirubin 0.3 - 1.2 mg/dL - 1.2 -  Alkaline Phos 38 - 126 U/L - 127(H) -  AST 15 - 41 U/L - 17 -  ALT 0 - 44 U/L - 14 -     Imaging studies: No new pertinent imaging studies   Assessment/Plan:  50 y.o. male doing well 1 Day Post-Op s/p I&D for right inguinal abscess, also with concomitant large left, asymptomatic, inguinal hernia without incarceration/strangulation/obstruction, complicated by pertinent comorbidities including uncontrolled DM   - Okay to continue diet from surgical perspective - Continue IV Abx (Zosyn); follow up OR Cx - Continue local wound care BID; pack with wet-to-dry gauze; cover, secure   - Pain control prn - Hyperglycemia control   - Further management per primary service; we will follow   All of the above findings and recommendations were discussed with the patient, and the medical team, and all of patient's questions were answered to his expressed satisfaction.  -- 43, PA-C [facility] 10/02/2020, 7:19 AM [phone] M-F: 7am - 4pm

## 2020-10-02 NOTE — Progress Notes (Addendum)
Inpatient Diabetes Program Recommendations  AACE/ADA: New Consensus Statement on Inpatient Glycemic Control (2015)  Target Ranges:  Prepandial:   less than 140 mg/dL      Peak postprandial:   less than 180 mg/dL (1-2 hours)      Critically ill patients:  140 - 180 mg/dL   Results for [name], [name] (MRN [id number]) as of 10/02/2020 07:10  Ref. Range 10/01/2020 12:26 10/01/2020 14:35 10/01/2020 16:45 10/01/2020 21:35  Glucose-Capillary Latest Ref Range: 70 - 99 mg/dL 255 (H)  5 units NOVOLOG  5 units Lantus 223 (H) 177 (H) 272 (H)  3 units NOVOLOG    Results for [name], [name] (MRN [id number]) as of 10/02/2020 08:33  Ref. Range 10/02/2020 07:08  Glucose-Capillary Latest Ref Range: 70 - 99 mg/dL 252 (H)   Results for [name], CHISOLM (MRN [id number]) as of 10/02/2020 07:10  Ref. Range 10/01/2020 11:02  Hemoglobin A1C Latest Ref Range: 4.8 - 5.6 % 12.1 (H)  (300 mg/dl)    Admit with Groin Pain/ Scrotal Swelling  History: DM, HIV, Inguinal Hernia (present for 8 mos)   Home DM Meds: None (since prior to pandemic) [No Insurance/ No PCP]   Current Orders: Lantus 5 units Daily     Novolog 0-9 units TID ac/hs      MD- Note CBG 252 this AM  Please consider the following:  1. Increase Lantus to 10 units Daily  2. Increase Novolog SSi to the 0-15 unit scale  Will likely need Insulin for home given A1c is 12.1%.  Have placed Consult to [facility] team since pt currently uninsured and does not have PCP listed.    Addendum 11am--Met w/ pt at beside with assistance from [facility] [place].  Pt told me he has had diabetes for about 2 years.  Not taking any meds for 2 years now.  Did take Metformin Daily at time of diagnosis.  Has CBG meter at home but No strips.  No PCP and no regular medical care.  Drinks and eats whatever he wants--consuming 5 small tortillas with each meal and drinks regular beverages.  Spoke with pt about diabetes care.  Discussed A1C results with him and explained what an A1C  is, basic pathophysiology of DM Type 2, basic home care, basic diabetes diet nutrition principles, importance of checking CBGs and maintaining good CBG control to prevent long-term and short-term complications.  Reviewed signs and symptoms of hyperglycemia and hypoglycemia and how to treat hypoglycemia at home.  Also reviewed blood sugar goals and A1c goals for home.    Also discussed w/ pt that Dr. Posey Pronto plans to send pt home on Metformin alone.  Explained what Metformin is, how it works, side effects, etc.  Stressed to pt the extreme importance of checking CBGs, maintaining healthy CBGs at home in order to [name] his current infection and prevent further complications.  Also strongly encouraged pt to keep all of his appts that are made for him by the [facility] team and to stay current at the [facility] for medication assistance.    Also discussed DM diet information with patient.  Encouraged patient to avoid beverages with sugar (regular soda, sweet tea, lemonade, fruit juice) and to consume mostly water.  Discussed what foods contain carbohydrates and how carbohydrates affect the body's blood sugar levels.  Encouraged patient to be careful with his portion sizes (especially grains, starchy vegetables, and fruits).  Strongly encouraged pt to only consume 1-2 small tortillas with meals and to only eat 1/3 to 1/2 cup beans  and/or rice with meals.       --Will follow patient during hospitalization--  Wyn Quaker RN, MSN, CDE Diabetes Coordinator Inpatient Glycemic Control Team Team Pager: [phone] (8a-5p)

## 2020-10-03 ENCOUNTER — Other Ambulatory Visit: Payer: Self-pay

## 2020-10-03 DIAGNOSIS — L0390 Cellulitis, unspecified: Secondary | ICD-10-CM

## 2020-10-03 LAB — HELPER T-LYMPH-CD4 (ARMC ONLY)
% CD 4 Pos. Lymph.: 1.8 % — ABNORMAL LOW (ref 30.8–58.5)
Absolute CD 4 Helper: 18 /uL — ABNORMAL LOW (ref 359–1519)
Basophils Absolute: 0 10*3/uL (ref 0.0–0.2)
Basos: 0 %
EOS (ABSOLUTE): 0.2 10*3/uL (ref 0.0–0.4)
Eos: 4 %
Hematocrit: 31.4 % — ABNORMAL LOW (ref 37.5–51.0)
Hemoglobin: 10.4 g/dL — ABNORMAL LOW (ref 13.0–17.7)
Immature Grans (Abs): 0 10*3/uL (ref 0.0–0.1)
Immature Granulocytes: 0 %
Lymphocytes Absolute: 1 10*3/uL (ref 0.7–3.1)
Lymphs: 21 %
MCH: 28.5 pg (ref 26.6–33.0)
MCHC: 33.1 g/dL (ref 31.5–35.7)
MCV: 86 fL (ref 79–97)
Monocytes Absolute: 0.4 10*3/uL (ref 0.1–0.9)
Monocytes: 9 %
Neutrophils Absolute: 3.1 10*3/uL (ref 1.4–7.0)
Neutrophils: 66 %
Platelets: 103 10*3/uL — ABNORMAL LOW (ref 150–450)
RBC: 3.65 x10E6/uL — ABNORMAL LOW (ref 4.14–5.80)
RDW: 12.2 % (ref 11.6–15.4)
WBC: 4.6 10*3/uL (ref 3.4–10.8)

## 2020-10-03 LAB — HIV-1/2 AB - DIFFERENTIATION
HIV 1 Ab: REACTIVE
HIV 2 Ab: NONREACTIVE

## 2020-10-03 LAB — GLUCOSE, CAPILLARY
Glucose-Capillary: 204 mg/dL — ABNORMAL HIGH (ref 70–99)
Glucose-Capillary: 256 mg/dL — ABNORMAL HIGH (ref 70–99)

## 2020-10-03 LAB — HIV-1 RNA QUANT-NO REFLEX-BLD
HIV 1 RNA Quant: 262000 copies/mL
LOG10 HIV-1 RNA: 5.418 log10copy/mL

## 2020-10-03 LAB — CREATININE, SERUM
Creatinine, Ser: 0.91 mg/dL (ref 0.61–1.24)
GFR, Estimated: >60 mL/min (ref >60)

## 2020-10-03 LAB — PROCALCITONIN: Procalcitonin: 0.3 ng/mL

## 2020-10-03 LAB — RPR: RPR Ser Ql: NONREACTIVE

## 2020-10-03 MED ORDER — SULFAMETHOXAZOLE-TRIMETHOPRIM 800-160 MG PO TABS
1.0000 | ORAL_TABLET | Freq: Two times a day (BID) | ORAL | 0 refills | Status: AC
  Filled 2020-10-03: qty 20, 10d supply, fill #0

## 2020-10-03 MED ORDER — OXYCODONE-ACETAMINOPHEN 5-325 MG PO TABS: 1.0000 | ORAL_TABLET | Freq: Three times a day (TID) | ORAL | 0 refills | Status: DC | PRN

## 2020-10-03 MED ORDER — SULFAMETHOXAZOLE-TRIMETHOPRIM 800-160 MG PO TABS
1.0000 | ORAL_TABLET | Freq: Two times a day (BID) | ORAL | Status: DC
  Administered 2020-10-03: 1 via ORAL
  Filled 2020-10-03 (×2): qty 1

## 2020-10-03 MED ORDER — METFORMIN HCL 850 MG PO TABS
850.0000 mg | ORAL_TABLET | Freq: Two times a day (BID) | ORAL | 1 refills | Status: AC
  Filled 2020-10-03: qty 60, 30d supply, fill #0

## 2020-10-03 MED ORDER — OXYCODONE-ACETAMINOPHEN 5 MG-325 MG TABLET
0 days
Start: 2020-10-03 — End: ?

## 2020-10-03 NOTE — TOC Transition Note (Signed)
Transition of Care [facility]) - CM/SW Discharge Note   Patient Details  Name: [name] MRN: [id number] Date of Birth: 1970-11-15  Transition of Care [facility] CM/SW Contact:  Allayne Butcher, RN Phone Number: 10/03/2020, 1:08 PM   Clinical Narrative:    Patient admitted to the hospital with Inguinal abscess, abscess drained and now has daily dressing changes.  Bedside RN will provide patient and his niece with education on dressing changes at discharge and show the niece how to change the dressing.  Patient reports that he will be staying with his niece at discharge.  Patient given application for [facility], referral made to both clinics and patient instructed to complete applications and follow up outpatient.  RNCM has obtained patient's medications from Medication management, given to patient to discharge with along with a donated glucometer so patient can check his blood sugars at home.  Patient instructed to follow up with [facility] ID, MD has given the patient the number to call to follow up with.       Final next level of care: Home/Self Care Barriers to Discharge: Barriers Resolved   Patient Goals and CMS Choice        Discharge Placement                       Discharge Plan and Services   Discharge Planning Services: CM Consult, Medication Assistance, Indigent Health Clinic            DME Arranged: N/A DME Agency: NA       HH Arranged: NA          Social Determinants of Health (SDOH) Interventions     Readmission Risk Interventions No flowsheet data found.

## 2020-10-03 NOTE — Progress Notes (Signed)
[facility] SURGICAL PROGRESS NOTE  Hospital Day(s): 2.   Post op day(s): 2 Days Post-Op.   Interval History:  Patient seen and examined No acute events or new complaints overnight.  Patient reports he is feeling much better Still with right inguinal soreness but this is significantly improved No fever chills Cx from OR with GPC in pairs; on Linezolid; ID following  Tolerating diet  Vital signs in last 24 hours: [min-max] current  Temp:  [98.1 F (36.7 C)-98.9 F (37.2 C)] 98.9 F (37.2 C) (07/15 0428) Pulse Rate:  [71-77] 71 (07/15 0428) Resp:  [16-18] 16 (07/15 0428) BP: (124-138)/(76-89) 130/87 (07/15 0428) SpO2:  [100 %] 100 % (07/15 0428)     Height: 5\' 2"  (157.5 cm) Weight: 99.8 kg BMI (Calculated): 40.23   Intake/Output last 2 shifts:  07/14 0701 - 07/15 0700 In: 271.8 [P.O.:120; I.V.:96.4; IV Piggyback:55.3] Out: 1800 [Urine:1800]   Physical Exam:  Constitutional: alert, cooperative and no distress Respiratory: breathing non-labored at rest Cardiovascular: regular rate and sinus rhythm Gastrointestinal: Still with large soft, chronic, left inguinal hernia Integumentary: I&D to right groin; dressing in place; surrounding induration but improved, no erythema; significant improvement    Labs:  CBC Latest Ref Rng & Units 10/02/2020 10/01/2020 04/12/2012  WBC 4.0 - 10.5 K/uL 4.6 9.0 7.7  Hemoglobin 13.0 - 17.0 g/dL 10.6(L) 12.7(L) 14.5  Hematocrit 39.0 - 52.0 % 30.0(L) 34.7(L) 40.8  Platelets 150 - 400 K/uL 103(L) 109(L) 198   CMP Latest Ref Rng & Units 10/03/2020 10/02/2020 10/01/2020  Glucose 70 - 99 mg/dL - 10/03/2020) 174(Y)  BUN 6 - 20 mg/dL - 814(G) 81(E)  Creatinine 0.61 - 1.24 mg/dL 56(D 1.49 7.02  Sodium 135 - 145 mmol/L - 132(L) 127(L)  Potassium 3.5 - 5.1 mmol/L - 3.6 3.6  Chloride 98 - 111 mmol/L - 105 96(L)  CO2 22 - 32 mmol/L - 22 20(L)  Calcium 8.9 - 10.3 mg/dL - 7.8(L) 8.2(L)  Total Protein 6.5 - 8.1 g/dL - - 7.5  Total Bilirubin 0.3 -  1.2 mg/dL - - 1.2  Alkaline Phos 38 - 126 U/L - - 127(H)  AST 15 - 41 U/L - - 17  ALT 0 - 44 U/L - - 14     Imaging studies: No new pertinent imaging studies   Assessment/Plan:  50 y.o. male with significant improvement 2 Days Post-Op s/p I&D for right inguinal abscess, also with concomitant large left, asymptomatic, inguinal hernia without incarceration/strangulation/obstruction, complicated by pertinent comorbidities including uncontrolled DM    - Okay to continue diet from surgical perspective - Continue IV Abx (Linezolid); Cx with GPC in pairs; ID following  - Continue local wound care BID; pack with wet-to-dry gauze; cover, secure              - Pain control prn - Hyperglycemia control              - Further management per primary service   - Discharge Planning: Nothing further to add from surgical perspective; continue local wound care at home, Abx per ID, he can follow up in surgery clinic in ~2 weeks. We can address his left inguinal hernia once recovered from this infection   All of the above findings and recommendations were discussed with the patient, and the medical team, and all of patient's questions were answered to his expressed satisfaction.  -- 43, PA-C [facility] 10/03/2020, 7:12 AM [phone] M-F: 7am - 4pm

## 2020-10-03 NOTE — Progress Notes (Signed)
ID  Rt femoral abscess s/p I/D Doing better Pain better Swelling better Patient Vitals for the past 24 hrs:  BP Temp Pulse Resp SpO2  10/03/20 0735 134/87 98.7 F (37.1 C) 68 18 100 %  10/03/20 0428 130/87 98.9 F (37.2 C) 71 16 100 %  10/02/20 2043 138/89 98.7 F (37.1 C) 77 16 100 %  10/02/20 1531 126/81 98.8 F (37.1 C) 75 18 100 %   Awak and alert Chest CTA Hss1s Abd soft Rt thigh- induration and erythema around the I/D site better. Packing present  Scrotal edema Labs CBC Latest Ref Rng & Units 10/02/2020 10/02/2020 10/01/2020  WBC 3.4 - 10.8 x10E3/uL 4.6 4.6 9.0  Hemoglobin 13.0 - 17.7 g/dL 10.6(L) 10.4(L) 12.7(L)  Hematocrit 37.5 - 51.0 % 30.0(L) 31.4(L) 34.7(L)  Platelets 150 - 450 x10E3/uL 103(L) 103(L) 109(L)     CMP Latest Ref Rng & Units 10/03/2020 10/02/2020 10/01/2020  Glucose 70 - 99 mg/dL - 664(Q) 034(V)  BUN 6 - 20 mg/dL - 42(V) 95(G)  Creatinine 0.61 - 1.24 mg/dL 3.87 5.64 3.32  Sodium 135 - 145 mmol/L - 132(L) 127(L)  Potassium 3.5 - 5.1 mmol/L - 3.6 3.6  Chloride 98 - 111 mmol/L - 105 96(L)  CO2 22 - 32 mmol/L - 22 20(L)  Calcium 8.9 - 10.3 mg/dL - 7.8(L) 8.2(L)  Total Protein 6.5 - 8.1 g/dL - - 7.5  Total Bilirubin 0.3 - 1.2 mg/dL - - 1.2  Alkaline Phos 38 - 126 U/L - - 127(H)  AST 15 - 41 U/L - - 17  ALT 0 - 44 U/L - - 14     Micro Staph aureus from culture Susceptibility pending   Impression recommendation- staph aureus abscess rt femoral area- susceptibility pending- currently on linezolid- will be discharged on bactrim X 10 days Will follow him as OP to make sue it has healed  AIDS- cd4 from this admission is 75 ( has dropped from 700s in 2019) as he has dropped out of care I called [facility] ID clinic and left a message with Morrie Sheldon the scheduler to talk to his provider- they dont have an appt until sept 28th and he needs to get on medication ASAP Will see whether we can get a month supply from the pharmaceutical next week until he is engaged in  care at [facility] The bactrim for the abscess will be  sufficient for PCP prophylaxis as well-   DM- management as per primary team Discussed the management with the patient and care team

## 2020-10-03 NOTE — Discharge Instructions (Addendum)
Wound Care:  -- Wash hands with soap and water, wear gloves if possible -- Remove previous packing -- With new packing, fill in wound until you meet resistance and cut, always remember to leave a tail -- Cover with dry bandage and secure  This needs to be preformed daily It is okay to remove packing and take a shower. NO baths or swimming until the wound is healed   Call office [phone] / [phone]) at any time if any questions, worsening pain, fevers/chills, bleeding, drainage from incision site, or other concerns.   Follow up with [facility] infectious disease--call the number to make the appt

## 2020-10-03 NOTE — Plan of Care (Signed)
Interpreter used for discharge instructions.  Patient and niece instructed on wound care and discharge instructions including medication changes, appointment reminders and glucose teaching.  Discharge packet given. Patient and Patient niece verbalized understanding of wound care and supplies given for home.

## 2020-10-03 NOTE — Discharge Summary (Signed)
[facility] [place] [facility]   PATIENT NAME: [name]    MR#:  [id number]  DATE OF BIRTH:  1970-04-04  DATE OF ADMISSION:  10/01/2020 ADMITTING PHYSICIAN: Lorretta Harp, MD  DATE OF DISCHARGE: 10/03/2020  PRIMARY CARE PHYSICIAN: Pcp, No    ADMISSION DIAGNOSIS:  Abscess [L02.91] Scrotal swelling [N50.89] Inguinal abscess [L02.214] Cellulitis, unspecified cellulitis site [L03.90]  DISCHARGE DIAGNOSIS:  Right Groin abscess s/p I and D DM-2  h/o HIV  SECONDARY DIAGNOSIS:   Past Medical History:  Diagnosis Date   Diabetes mellitus without complication (HCC)    HIV (human immunodeficiency virus infection) (HCC)    Kidney stone    Left inguinal hernia     HOSPITAL COURSE:   [name] is a 50 y.o. male with medical history significant of self-reported history of diabetes and HIV, not taking medications for several years, left inguinal hernia, kidney stone, who presents with pain in right inguinal area.   Sepsis due to right inguinal abscess: Patient meets criteria for sepsis with tachycardia with heart rate in 99 and tachypnea with RR 28.  Lactic acid is normal 1.2.  Currently hemodynamically stable.  No fever or leukocytosis.   -- Status post incision drainage per surgery. -- PRN Zofran for nausea, fentanyl and Percocet for pain -- Blood cultures x 2--negative -- was on  IV Zosyn--change to po Bactrim x 10 days per ID - -Pro-calcitonin 0.28 --per surgery wound appears to be improving --Wound culture few GPC   Diabetes mellitus without complication (HCC): uncontrolled, hyperglycemia --A!c 12.1 today, poorly controlled.  Patient is not taking medications currently -SSI -- start patient on metformin 500 BID. Will increase dose according to 850 mg bid -- diabetes coordinator consult noted --glucometer provided   Left inguinal hernia -Follow-up surgeon's recommendation   chronic hydronephrosis: Renal function okay -Consulted Dr. Apolinar Junes of  urology-- appreciated. No urology intervention needed at present. Patient will follow-up as outpatient. Will discontinue Foley. -- good urine output   Thrombocytopenia (HCC): Platelet 109, likely due to ongoing infection and  HIV  HIV (human immunodeficiency virus infection) (HCC): Patient's not taking medications currently -Check viral load and CD4 level -infectious disease Dr. Joylene Draft recommends pt to f/u [facility] infectious dz (phone and add provided)  D/c home       DVT ppx: SCD Code Status: Full code Family Communication: no family member is at bed side.   Disposition Plan:  Anticipate discharge back to previous environment Consults called:  Dr. Apolinar Junes of urology and Dr. Claudine Mouton of general surgery, ID Admission status and Level of care: Med-Surg:    as inpt     Procedures: I and D       CONSULTS OBTAINED:  Treatment Team:  Campbell Lerner, MD Vanna Scotland, MD  DRUG ALLERGIES:   Allergies  Allergen Reactions   Morphine And Related Rash    DISCHARGE MEDICATIONS:   Allergies as of 10/03/2020       Reactions   Morphine And Related Rash        Medication List     TAKE these medications    metFORMIN 850 MG tablet Commonly known as: GLUCOPHAGE Take 1 tablet (850 mg total) by mouth 2 (two) times daily with a meal.   oxyCODONE-acetaminophen 5-325 MG tablet Commonly known as: PERCOCET/ROXICET Take 1 tablet by mouth every 8 (eight) hours as needed for moderate pain.   sulfamethoxazole-trimethoprim 800-160 MG tablet Commonly known as: BACTRIM DS Take 1 tablet by mouth every 12 (twelve)  hours for 10 days.        If you experience worsening of your admission symptoms, develop shortness of breath, life threatening emergency, suicidal or homicidal thoughts you must seek medical attention immediately by calling 911 or calling your MD immediately  if symptoms less severe.  You Must read complete instructions/literature along with all the possible adverse  reactions/side effects for all the Medicines you take and that have been prescribed to you. Take any new Medicines after you have completely understood and accept all the possible adverse reactions/side effects.   Please note  You were cared for by a hospitalist during your hospital stay. If you have any questions about your discharge medications or the care you received while you were in the hospital after you are discharged, you can call the unit and asked to speak with the hospitalist on call if the hospitalist that took care of you is not available. Once you are discharged, your primary care physician will handle any further medical issues. Please note that NO REFILLS for any discharge medications will be authorized once you are discharged, as it is imperative that you return to your primary care physician (or establish a relationship with a primary care physician if you do not have one) for your aftercare needs so that they can reassess your need for medications and monitor your lab values. Today   SUBJECTIVE   Via interpreter--no new complaints all questions regarding d/c discussed  VITAL SIGNS:  Blood pressure 134/87, pulse 68, temperature 98.7 F (37.1 C), resp. rate 18, height 5\' 2"  (1.575 m), weight 99.8 kg, SpO2 100 %.  I/O:   Intake/Output Summary (Last 24 hours) at 10/03/2020 1118 Last data filed at 10/03/2020 1046 Gross per 24 hour  Intake 1359.06 ml  Output 1900 ml  Net -540.94 ml    PHYSICAL EXAMINATION:  GENERAL:  50 y.o.-year-old patient lying in the bed with no acute distress. LUNGS: Normal breath sounds bilaterally, no wheezing, rales, rhonchi. No use of accessory muscles of respiration. CARDIOVASCULAR: S1, S2 normal. No murmurs, rubs, or gallops. ABDOMEN: Soft, nontender, nondistended. Bowel sounds present. No organomegaly or mass.  Right groin packing + EXTREMITIES: No cyanosis, clubbing or edema b/l.   44   PSYCHIATRIC:  patient is alert and oriented x 3. SKIN: No  obvious rash, lesion, or ulcer DATA REVIEW:   CBC  Recent Labs  Lab 10/02/20 0525  WBC 4.6  4.6  HGB 10.6*  10.4*  HCT 30.0*  31.4*  PLT 103*  103*    Chemistries  Recent Labs  Lab 10/01/20 0533 10/02/20 0525 10/03/20 0442  NA 127* 132*  --   K 3.6 3.6  --   CL 96* 105  --   CO2 20* 22  --   GLUCOSE 407* 265*  --   BUN 21* 21*  --   CREATININE 0.98 0.97 0.91  CALCIUM 8.2* 7.8*  --   AST 17  --   --   ALT 14  --   --   ALKPHOS 127*  --   --   BILITOT 1.2  --   --     Microbiology Results   Recent Results (from the past 240 hour(s))  Resp Panel by RT-PCR (Flu A&B, Covid) Nasopharyngeal Swab     Status: None   Collection Time: 10/01/20  5:33 AM   Specimen: Nasopharyngeal Swab; Nasopharyngeal(NP) swabs in vial transport medium  Result Value Ref Range Status   SARS Coronavirus 2 by RT PCR  NEGATIVE NEGATIVE Final    Comment: (NOTE) SARS-CoV-2 target nucleic acids are NOT DETECTED.  The SARS-CoV-2 RNA is generally detectable in upper respiratory specimens during the acute phase of infection. The lowest concentration of SARS-CoV-2 viral copies this assay can detect is 138 copies/mL. A negative result does not preclude SARS-Cov-2 infection and should not be used as the sole basis for treatment or other patient management decisions. A negative result may occur with  improper specimen collection/handling, submission of specimen other than nasopharyngeal swab, presence of viral mutation(s) within the areas targeted by this assay, and inadequate number of viral copies(<138 copies/mL). A negative result must be combined with clinical observations, patient history, and epidemiological information. The expected result is Negative.  Fact Sheet for Patients:  BloggerCourse.com  Fact Sheet for Healthcare Providers:  SeriousBroker.it  This test is no t yet approved or cleared by the [place] FDA and  has been  authorized for detection and/or diagnosis of SARS-CoV-2 by FDA under an Emergency Use Authorization (EUA). This EUA will remain  in effect (meaning this test can be used) for the duration of the COVID-19 declaration under Section 564(b)(1) of the Act, 21 U.S.C.section 360bbb-3(b)(1), unless the authorization is terminated  or revoked sooner.       Influenza A by PCR NEGATIVE NEGATIVE Final   Influenza B by PCR NEGATIVE NEGATIVE Final    Comment: (NOTE) The Xpert Xpress SARS-CoV-2/FLU/RSV plus assay is intended as an aid in the diagnosis of influenza from Nasopharyngeal swab specimens and should not be used as a sole basis for treatment. Nasal washings and aspirates are unacceptable for Xpert Xpress SARS-CoV-2/FLU/RSV testing.  Fact Sheet for Patients: BloggerCourse.com  Fact Sheet for Healthcare Providers: SeriousBroker.it  This test is not yet approved or cleared by the [place] FDA and has been authorized for detection and/or diagnosis of SARS-CoV-2 by FDA under an Emergency Use Authorization (EUA). This EUA will remain in effect (meaning this test can be used) for the duration of the COVID-19 declaration under Section 564(b)(1) of the Act, 21 U.S.C. section 360bbb-3(b)(1), unless the authorization is terminated or revoked.  Performed at [facility], [address] [place], [place] [address]   Blood Culture (routine x 2)     Status: None (Preliminary result)   Collection Time: 10/01/20  5:34 AM   Specimen: BLOOD  Result Value Ref Range Status   Specimen Description BLOOD BLOOD RIGHT FOREARM  Final   Special Requests   Final    BOTTLES DRAWN AEROBIC AND ANAEROBIC Blood Culture adequate volume   Culture   Final    NO GROWTH 1 DAY Performed at [facility], [address], [place], [place] [address]    Report Status PENDING  Incomplete  Blood Culture (routine x 2)     Status: None (Preliminary  result)   Collection Time: 10/01/20  5:34 AM   Specimen: BLOOD  Result Value Ref Range Status   Specimen Description BLOOD BLOOD LEFT FOREARM  Final   Special Requests   Final    BOTTLES DRAWN AEROBIC AND ANAEROBIC Blood Culture adequate volume   Culture   Final    NO GROWTH 1 DAY Performed at [facility], [address], [place], [place] [address]    Report Status PENDING  Incomplete  Urine culture     Status: Abnormal   Collection Time: 10/01/20  7:13 AM   Specimen: In/Out Cath Urine  Result Value Ref Range Status   Specimen Description  Final    IN/OUT CATH URINE Performed at [facility], [address] [place], [place] [address]    Special Requests   Final    NONE Performed at [facility], [address] [place], [place] [address]    Culture 40,000 COLONIES/mL YEAST (A)  Final   Report Status 10/02/2020 FINAL  Final  Aerobic/Anaerobic Culture w Gram Stain (surgical/deep wound)     Status: None (Preliminary result)   Collection Time: 10/01/20  4:30 PM   Specimen: PATH Lymph node biopsy; Tissue  Result Value Ref Range Status   Specimen Description   Final    TISSUE Performed at [facility], [address] [place], [place] [address]    Special Requests   Final    RIGHT FEMORAL LYMPH NODE Performed at [facility], [address] [place], [place] [address]    Gram Stain NO WBC SEEN FEW GRAM POSITIVE COCCI IN PAIRS   Final   Culture   Final    ABUNDANT STAPHYLOCOCCUS AUREUS SUSCEPTIBILITIES TO FOLLOW Performed at [facility], [address] [place], [place] [address]    Report Status PENDING  Incomplete    RADIOLOGY:  No results found.   CODE STATUS:     Code Status Orders  (From admission, onward)           Start     Ordered   10/01/20 0947  Full code  Continuous        10/01/20 0946           Code Status History     This patient has a current code status but no historical code  status.        TOTAL TIME TAKING CARE OF THIS PATIENT: 40 minutes.    Enedina FinnerSona Winni Ehrhard M.D  [facility]    CC: Primary care physician; Pcp, No

## 2020-10-06 LAB — CULTURE, BLOOD (ROUTINE X 2)
Culture: NO GROWTH
Culture: NO GROWTH
Special Requests: ADEQUATE
Special Requests: ADEQUATE

## 2020-10-07 LAB — AEROBIC/ANAEROBIC CULTURE W GRAM STAIN (SURGICAL/DEEP WOUND): Gram Stain: NONE SEEN

## 2020-10-09 ENCOUNTER — Ambulatory Visit: Admit: 2020-10-09 | Discharge: 2020-10-10

## 2020-10-09 DIAGNOSIS — B20 Human immunodeficiency virus [HIV] disease: Principal | ICD-10-CM

## 2020-10-09 DIAGNOSIS — E119 Type 2 diabetes mellitus without complications: Principal | ICD-10-CM

## 2020-10-09 DIAGNOSIS — L02214 Cutaneous abscess of groin: Principal | ICD-10-CM

## 2020-10-09 MED ORDER — DESCOVY 200 MG-25 MG TABLET
ORAL_TABLET | Freq: Every day | ORAL | 2 refills | 30.00000 days | Status: CP
Start: 2020-10-09 — End: 2020-10-09
  Filled 2020-10-10: qty 30, 30d supply, fill #0

## 2020-10-09 MED ORDER — DARUNAVIR ETHANOLATE 600 MG TABLET
ORAL_TABLET | Freq: Two times a day (BID) | ORAL | 2 refills | 30.00000 days | Status: CP
Start: 2020-10-09 — End: 2020-10-09
  Filled 2020-10-10: qty 60, 30d supply, fill #0

## 2020-10-09 MED ORDER — CLOTRIMAZOLE 1 % TOPICAL CREAM
Freq: Two times a day (BID) | TOPICAL | 0 refills | 0.00000 days | Status: CP
Start: 2020-10-09 — End: 2021-10-09
  Filled 2020-10-09: qty 30, 15d supply, fill #0

## 2020-10-09 MED ORDER — FLUCONAZOLE 150 MG TABLET
ORAL_TABLET | Freq: Every day | ORAL | 0 refills | 7.00000 days | Status: CP
Start: 2020-10-09 — End: 2020-10-09
  Filled 2020-10-09: qty 7, 7d supply, fill #0

## 2020-10-09 MED ORDER — ATORVASTATIN 20 MG TABLET
ORAL_TABLET | Freq: Every day | ORAL | 2 refills | 30 days | Status: CP
Start: 2020-10-09 — End: 2021-01-07
  Filled 2020-10-09: qty 30, 30d supply, fill #0

## 2020-10-09 MED ORDER — RITONAVIR 100 MG TABLET
ORAL_TABLET | Freq: Two times a day (BID) | ORAL | 2 refills | 30.00000 days | Status: CP
Start: 2020-10-09 — End: 2020-10-09
  Filled 2020-10-10: qty 60, 30d supply, fill #0

## 2020-10-09 MED ORDER — CLOTRIMAZOLE 1 % TOPICAL OINTMENT
TOPICAL | 1 refills | 0.00000 days | Status: CP
Start: 2020-10-09 — End: 2020-10-09

## 2020-10-09 MED ORDER — DOXYCYCLINE HYCLATE 100 MG TABLET
ORAL_TABLET | Freq: Two times a day (BID) | ORAL | 0 refills | 10.00000 days | Status: CP
Start: 2020-10-09 — End: 2020-10-09

## 2020-10-09 MED ORDER — IBUPROFEN 600 MG TABLET
ORAL_TABLET | Freq: Three times a day (TID) | ORAL | 0 refills | 34.00000 days | Status: CP | PRN
Start: 2020-10-09 — End: 2020-10-09
  Filled 2020-10-09: qty 100, 34d supply, fill #0

## 2020-10-09 MED ORDER — METFORMIN 850 MG TABLET
ORAL_TABLET | Freq: Two times a day (BID) | ORAL | 0 refills | 90.00000 days | Status: CP
Start: 2020-10-09 — End: 2020-10-09
  Filled 2020-10-10: qty 60, 30d supply, fill #0

## 2020-10-09 NOTE — Unmapped (Signed)
[facility] Specialty Medication Onboarding    Specialty Medication: DESCOVY 200-25 mg tablet (emtricitabine-tenofovir alafen)  Prior Authorization: Not Required   Financial Assistance: No - copay  <$25  Final Copay/Day Supply: $0 / 30    Insurance Restrictions: None     Notes to Pharmacist: n/a    The triage team has completed the benefits investigation and has determined that the patient is able to fill this medication at [facility]. Please contact the patient to complete the onboarding or follow up with the prescribing physician as needed.    [facility]SC Specialty Medication Onboarding    Specialty Medication: PREZISTA 600 MG tablet (darunavir ethanolate)  Prior Authorization: Not Required   Financial Assistance: No - copay  <$25  Final Copay/Day Supply: $0 / 30    Insurance Restrictions: None     Notes to Pharmacist: n/a    The triage team has completed the benefits investigation and has determined that the patient is able to fill this medication at [facility]. Please contact the patient to complete the onboarding or follow up with the prescribing physician as needed.    [facility] SSC Specialty Medication Onboarding    Specialty Medication: riTONAvir 100 mg tablet (NORVIR)  Prior Authorization: Not Required   Financial Assistance: No - copay  <$25  Final Copay/Day Supply: $0 / 30    Insurance Restrictions: None     Notes to Pharmacist: n/a    The triage team has completed the benefits investigation and has determined that the patient is able to fill this medication at [facility]. Please contact the patient to complete the onboarding or follow up with the prescribing physician as needed.

## 2020-10-09 NOTE — Unmapped (Signed)
This onboarding is for the following medications:     1. Descovy 200-25mg   2. Prezista 600mg   3. Ritonavir 100mg     [facility]   Patient Onboarding/Medication Counseling    Francisco Ortiz is a 50 y.o. male with HIV who I am counseling today on continuation of therapy.  I am speaking to the patient.    Was a Nurse, learning disability used for this call? Yes, Spanish [employer] [phone] and [phone]). Patient language is appropriate in [facility]    Verified patient's date of birth / HIPAA.    Specialty medication(s) to be sent: Infectious Disease: Descovy, Prezista and ritonavir      Non-specialty medications/supplies to be sent: metformin 850mg       Medications not needed at this time: n/a         1. Descovy (emtricitabine and tenofovir alafenamide)    The patient declined counseling on medication administration, missed dose instructions, goals of therapy, side effects and monitoring parameters, warnings and precautions, drug/food interactions and storage, handling precautions, and disposal because they have taken the medication previously. The information in the declined sections below are for informational purposes only and was not discussed with patient.       Medication & Administration     Dosage: Take 1 tablet by mouth daily    Administration: Take with or without food    Adherence/Missed dose instructions: take missed dose as soon as you remember. If it is close to the time of your next dose, skip the dose and resume with your next scheduled dose.    Goals of Therapy     Keep HIV levels non-detectable on lab tests    Side Effects & Monitoring Parameters   Common Side Effects:  ??? Upset stomach  ??? Diarrhea    The following side effects should be reported to the provider:  ?? Signs of an allergic reaction, like rash; hives; itching; red, swollen, blistered, or peeling skin with or without fever; wheezing; tightness in the chest or throat; trouble breathing, swallowing, or talking; unusual hoarseness; or swelling of the mouth, face, lips, tongue, or throat.   ?? Signs of kidney problems like unable to pass urine, change in how much urine is passed, blood in the urine, or a big weight gain.  ?? Signs of liver problems like dark urine, feeling tired, not hungry, upset stomach or stomach pain, light-colored stools, throwing up, or yellow skin or eyes.   ?? Signs of too much lactic acid in the blood (lactic acidosis) like fast breathing, fast heartbeat, a heartbeat that does not feel normal, very bad upset stomach or throwing up, feeling very sleepy, shortness of breath, feeling very tired or weak, very bad dizziness, feeling cold, or muscle pain or cramps  ?? Weight gain    Monitoring Parameters:   - Serum creatinine  - Urine glucose  - Urine protein (prior to or when initiating therapy and as clinically indicated during therapy);  - Serum phosphorus (in patients with chronic kidney disease)  - Hepatic function tests  - Testing for hepatitis B virus (HBV) is recommended prior to or when initiating antiretroviral therapy.  - Patients with HIV and HBV coinfection should be monitored for several months following therapy discontinuation.  - CD4 count  - HIV RNA plasma levels     Contraindications, Warnings, & Precautions     ?? Signs and symptoms of immune reconstitution syndrome  ?? Signs and symptoms of lactic acidosis  ?? Hepatomegaly  ?? Steatosis  ??  Renal toxicity    Drug/Food Interactions     ??? Medication list reviewed in Epic. The patient was instructed to inform the care team before taking any new medications or supplements.   ??? Drug interaction(s) as follows.    o Ritonavir: levels of tenofovir alafenamide are increased by ritonavir increasing the possibility of adverse effects.  He has been taking together with no problems.    Storage, Handling Precautions, & Disposal     ?? Store this medication at room temperature.   ?? Store in the original container   ?? Keep lid tightly closed.   ?? Store in a dry place. Do not store in a bathroom.   ?? Keep all drugs in a safe place. Keep all drugs out of the reach of children and pets.   ?? Throw away unused or expired drugs. Do not flush down a toilet or pour down a drain unless you are told to do so. Check with your pharmacist if you have questions about the best way to throw out drugs. There may be drug take-back programs in your area    2. Prezista (darunavir) 600 mg    The patient declined counseling on medication administration, missed dose instructions, goals of therapy, side effects and monitoring parameters, warnings and precautions, drug/food interactions and storage, handling precautions, and disposal because they have taken the medication previously. The information in the declined sections below are for informational purposes only and was not discussed with patient.       Medication & Administration     Dosage: Take 1 tablet by mouth twice daily    Administration: Take with food at the same time as when you take your ritonavir    Adherence/Missed dose instructions: take missed dose as soon as you remember. If it is close to the time of your next dose, skip the dose and resume with your next scheduled dose.    Goals of Therapy     The goal is to suppress viral replication so HIV levels are undetectable in lab tests    Side Effects & Monitoring Parameters     ??? Headache  ??? Stomach pain or diarrhea  ??? Upset stomach or throwing up      The following side effects should be reported to the provider:  ?? Signs of an allergic reaction, like rash; hives; itching; red, swollen, blistered, or peeling skin with or without fever; wheezing; tightness in the chest or throat; trouble breathing, swallowing, or talking; unusual hoarseness; or swelling of the mouth, face, lips, tongue, or throat.   ?? Signs of high blood sugar like confusion, feeling sleepy, more thirst, more hunger, passing urine more often, flushing, fast breathing, or breath that smells like fruit.   ?? Feeling very tired or weak. ?? Change in body fat.   ?? Muscle or joint pain.   ?? A very bad skin reaction (Stevens-Johnson syndrome/toxic epidermal necrolysis) may happen. It can cause very bad health problems that may not go away, and sometimes death. Get medical help right away if you have signs like red, swollen, blistered, or peeling skin (with or without fever); red or irritated eyes; or sores in your mouth, throat, nose, or eyes.   ?? Liver problems have rarely happened with this drug. Sometimes, this has been deadly. Call your doctor right away if you have signs of liver problems like dark urine, feeling tired, not hungry, upset stomach or stomach pain, light-colored stools, throwing up, or yellow skin or eyes.   ??  Changes in your immune system can happen when you start taking drugs to treat HIV. If you have an infection that you did not know you had, it may show up when you take this drug. Tell your doctor right away if you have any new signs after you start this drug, even after taking it for several months. This includes signs of infection like fever, sore throat, weakness, cough, or shortness of breath.    Monitoring Parameters:  ?? Viral load  ?? CD4   ?? Baseline genotypic and/or phenotypic testing in treatment-experienced patients (if possible)  ?? Serum glucose  ?? transaminase levels prior to and during therapy (increase monitoring in patients at risk for liver impairment)  ?? Cholesterol  ?? Triglycerides       Contraindications, Warnings, & Precautions   ??? Hepatotoxicity  ??? Hypersensitivity reactions (rash, Stevens-Johnson Syndrome  ??? Immune Reconstitution Syndrome: occurrence of an inflammatory response to an indolent or residual opportunistic infection during initial HIV treatment or activation of autoimmune disorders (eg, Graves disease, polymyositis, Guillain-Barr??         syndrome, autoimmune hepatitis) later in therapy; further evaluation and treatment may be required  ??? Sulfonamide allergy: contains sulfa moiety  ??? Diabetes: Changes in glucose tolerance, hyperglycemia, exacerbation of diabetes, DKA, and new-onset diabetes mellitus have been reported in patients receiving protease inhibitors. Initiation or dose adjustments of antidiabetic agents may be  required.  ??? Hemophilia A or B: increased bleeding risk     Drug/Food Interactions   ??? Medication list reviewed in Epic. The patient was instructed to inform the care team before taking any new medications or supplements.  ???  Drug interaction(s) as follows.    o Fluconazole: can increase levels of Prezista. Has been taking together with not problems  o Atorvastatin: Prezista can increase levels of atorvastatin.  Told him to monitor for signs of toxicity such as muscle pain or muscle pain with darkened urine and report to his provider. He was instructed by Lahoma Rocker, PA to hold the atorvastatin for the 7 days that he is on fluconazole.  o Metformin: Prezista can elevate blood glucose. Needs to closely monitor his blood glucose. He currently tests bid  o Glipizide:Prezista can elevate blood glucose. Needs to closely monitor his blood glucose. He currently tests bid  o Oxycodone: Prezista can elevate levels of oxycodone. Cautioned him to watch for increased sedation and dizziness and to be careful getting up from a sitting or laying position.    Storage, Handling Precautions, & Disposal   ??? Store this medication at room temperature.  ?? Store in a dry place. Do not store in a bathroom.   ?? Keep all drugs in a safe place. Keep all drugs out of the reach of children and pets.   ?? Throw away unused or expired drugs. Do not flush down a toilet or pour down a drain unless you are told to do so. Check with your pharmacist if you have questions about the best way to throw out drugs. There may be drug take-back programs in your area.    3. Norvir (ritonavir)    The patient declined counseling on medication administration, missed dose instructions, goals of therapy, side effects and monitoring parameters, warnings and precautions, drug/food interactions and storage, handling precautions, and disposal because they have taken the medication previously. The information in the declined sections below are for informational purposes only and was not discussed with patient.       Medication & Administration  Dosage: Take one tablet (100mg )  by mouth twice daily    Administration: Take with meals    Adherence/Missed dose instructions: take missed dose as soon as you remember. If it is close to the time of your next dose, skip the dose and resume with your next scheduled dose.    Goals of Therapy     To keep HIV at a non-detectable level    Side Effects & Monitoring Parameters     Common Side Effects:  ?? Feeling tired or weak.   ?? Stomach pain or diarrhea.   ?? Heartburn.   ?? Upset stomach or throwing up.   ?? Gas.   ?? Change in taste.   ?? Numbness and tingling of feet or hands.   ?? Numbness or tingling around the mouth.   ?? Back, muscle, or joint pain.    The following side effects should be reported to the provider:    ?? Signs of an allergic reaction, like rash; hives; itching; red, swollen, blistered, or peeling skin with or without fever; wheezing; tightness in the chest or throat; trouble breathing, swallowing, or talking; unusual hoarseness; or swelling of the mouth, face, lips, tongue, or throat.   ?? Signs of high blood sugar like confusion, feeling sleepy, more thirst, more hungry, passing urine more often, flushing, fast breathing, or breath that smells like fruit.   ?? Severe or long-lasting diarrhea. This may lead to fluid and electrolyte problems.   ?? Chest pain or pressure.   ?? Fast or abnormal heartbeat.   ?? Dizziness or passing out.   ?? Flushing.   ?? Any unexplained bruising or bleeding.   ?? Sweating a lot.   ?? Feeling very tired or weak.   ?? Swelling.   ?? Blurred eyesight.   ?? Change in body fat.   ?? Signs of a very bad skin reaction (Stevens-Johnson syndrom, toxic epidermal necrolysis), including red, swollen, blistered, or peeling skin (with or without fever); red or irritated eyes; or sores in the mouth, throat, nose, or eyes.   ?? Signs of liver problems like dark urine, feeling tired, not hungry, upset stomach or stomach pain, light-colored stools, throwing up, or yellow skin or eyes.   ?? Signs of pancreatitis like very bad stomach pain, very bad back pain, or very upset stomach or throwing up.    Monitoring Parameters:   ?? CD4 count and viral load every 3 to 4 months  ?? CBC with differential, LFTs, BUN, creatinine, electrolytes, glucose, and urinalysis (every 6 to 12 months),    Contraindications, Warnings, & Precautions     ??? BBW: coadministration of ritonavir with several classes of drugs (including antiarrhythmics, ergot alkaloids, and sedatives/hypnotics) may result in potentially serious and/or life-threatening adverse reactions  ??? Elevated bilirubin  ??? Fat redistribution, for example, central obesity, buffalo hump, peripheral wasting, facial wasting, breast enlargement, cushingoid appearance.  ??? Hypersensitivity reactions, including rash, anaphylaxis (rare), angioedema, bronchospasm, erythema multiforme, Stevens-Johnson syndrome (rare), and/or toxic skin eruptions (including DRESS [drug rash, eosinophilia and systemic symptoms] syndrome).   ??? Immune reconstitution syndrome resulting in the occurrence of an inflammatory response to an indolent or residual opportunistic infection during or activation of autoimmune disorders (eg, Graves disease, polymyositis, Guillain-Barr?? syndrome)   ??? Nephrolithiasis/cholelithiasis  ??? Hepatotoxicity  ??? Increased cholesterol  ??? AV block  ??? Pancreatitis in patients with increased triglycerides  ??? Use caution in patients with cardiovascular disease, diabetes, hemophilia A or B, or hepatic impairment    Drug/Food  Interactions     ??? Medication list reviewed in Epic. The patient was instructed to inform the care team before taking any new medications or supplements. ??? Drug interaction(s) as follows.   o Fluconazole: may increase levels of ritonavir thus increasing possiblities of adverse effects from ritonavir.    o Atorvastatin: Increases level of atorvastatin from ritonavir.  Told him to monitor for toxicity such as muscle pain or muscle pain combined with darkened urine and report to his provider.  He was instructed by Lahoma Rocker, PA to hold the atorvastatin for the 7 days that he is on fluconazole.  o Oxycodone: may increase levels of oxycodone.   Cautioned him to watch for increased sedation and dizziness and to be careful getting up from a sitting or laying position  o Descovy: ritonavir can increase levels of Descovy increasing the risk of adverse effects.  He has taken together with no problems.  o Metformin: ritonavir can elevate blood glucose. Needs to closely monitor his blood glucose. He currently tests bid  o Glipizide: ritonavir can elevate blood glucose. Needs to closely monitor his blood glucose. He currently tests bid    Storage, Handling Precautions, & Disposal      ??? Store this medication at room temperature.  ??? Store in a dry room  ??? Throw away unused or expired drugs. Do not flush down a toilet or pour down a drain unless you are told to do so. Check with your pharmacist if you have questions about the best way to throw out drugs. There may be drug take-back programs in your area.        Current Medications (including OTC/herbals), Comorbidities and Allergies     Current Outpatient Medications   Medication Sig Dispense Refill   ??? atorvastatin (LIPITOR) 20 MG tablet Take 1 tablet (20 mg total) by mouth daily. 30 tablet 2   ??? blood sugar diagnostic (FREESTYLE TEST) Strp by Other route every morning. 30 strip 5   ??? blood-glucose meter (GLUCOSE MONITORING KIT) kit Use as instructed 1 each 0   ??? clotrimazole (LOTRIMIN) 1 % cream Apply topically Two (2) times a day. 30 g 0   ??? darunavir ethanolate (PREZISTA) 600 MG tablet Take 1 tablet (600 mg total) by mouth two (2) times a day. 60 tablet 2   ??? doxycycline (VIBRA-TABS) 100 MG tablet Take 1 tablet (100 mg total) by mouth Two (2) times a day. 20 tablet 0   ??? emtricitabine-tenofovir alafen (DESCOVY) 200-25 mg tablet Take 1 tablet by mouth daily. 30 tablet 2   ??? fluconazole (DIFLUCAN) 150 MG tablet Take 1 tablet (150 mg total) by mouth daily. 7 tablet 0   ??? glipiZIDE (GLUCOTROL) 5 MG tablet TAKE 1 TABLET(5 MG) BY MOUTH TWICE DAILY 30 MINUTES BEFORE A MEAL 180 tablet 3   ??? ibuprofen (MOTRIN) 600 MG tablet Take 1 tablet (600 mg total) by mouth every eight (8) hours as needed for pain. 100 tablet 0   ??? lancets Misc 1 Units by Miscellaneous route every morning. 30 each 5   ??? lisinopriL (PRINIVIL,ZESTRIL) 5 MG tablet Take 1 tablet (5 mg total) by mouth daily. TAKE 1 TABLET(5 MG) BY MOUTH DAILY 30 tablet 0   ??? metFORMIN (GLUCOPHAGE) 850 MG tablet Take 1 tablet (850 mg total) by mouth 2 (two) times a day with meals. 180 tablet 0   ??? oxyCODONE-acetaminophen (PERCOCET) 5-325 mg per tablet TAKE 1 TABLET BY MOUTH EVERY 8 HOURS AS NEEDED FOR MODERATE PAIN     ???  riTONAvir (NORVIR) 100 mg tablet Take 1 tablet (100 mg total) by mouth Two (2) times a day. 60 tablet 2   ??? sulfamethoxazole-trimethoprim (BACTRIM DS) 800-160 mg per tablet Take 1 tablet by mouth every twelve (12) hours.       No current facility-administered medications for this visit.       Allergies   Allergen Reactions   ??? Iohexol      Other reaction(s): SHORTNESS OF BREATH   ??? Morphine      Other reaction(s): HIVES   ??? Vancomycin      Other reaction(s): HIVES       Patient Active Problem List   Diagnosis   ??? HIV disease (CMS-HCC)   ??? Lower urinary tract infectious disease   ??? Nephrolithiasis   ??? CMV duodenitis (CMS-HCC)   ??? Congenital hiatus hernia   ??? Type 2 diabetes mellitus without complication (CMS-HCC)       Reviewed and up to date in Epic.    Appropriateness of Therapy     Acute infections noted within Epic:  No active infections  Patient reported infection: None    Is medication and dose appropriate based on diagnosis and infection status? Yes    Prescription has been clinically reviewed: Yes      Baseline Quality of Life Assessment      How many days over the past month did your HIV  keep you from your normal activities? For example, brushing your teeth or getting up in the morning. 0    Financial Information     Medication Assistance provided: None Required    Anticipated copay of $0.00 for ritonavir,  $0.00 for Descovy, and $0.00 for Prezista reviewed with patient.     Verified delivery address.    Delivery Information     Scheduled delivery date: 10/13/20    Expected start date: continuation of current therapy    Medication will be delivered via UPS to the temporary address in [facility].  This shipment will not require a signature.      Explained the services we provide at [facility] and that each month we would call to set up refills.  Stressed importance of returning phone calls so that we could ensure they receive their medications in time each month.  Informed patient that we should be setting up refills 7-10 days prior to when they will run out of medication.  A pharmacist will reach out to perform a clinical assessment periodically.  Informed patient that a welcome packet, containing information about our pharmacy and other support services, a Notice of Privacy Practices, and a drug information handout will be sent.      The patient or caregiver noted above participated in the development of this care plan and knows that they can request review of or adjustments to the care plan at any time.      Patient or caregiver verbalized understanding of the above information as well as how to contact the pharmacy at [phone] option 4 with any questions/concerns.  The pharmacy is open Monday through Friday 8:30am-4:30pm.  A pharmacist is available 24/7 via pager to answer any clinical questions they may have.    Patient Specific Needs - Does the patient have any physical, cognitive, or cultural barriers? No    - Does the patient have adequate living arrangements? (i.e. the ability to store and take their medication appropriately) Yes    - Did you identify any home environmental safety or security  hazards? No    - Patient prefers to have medications discussed with  Patient     - Is the patient or caregiver able to read and understand education materials at a high school level or above? Yes    - Patient's primary language is  Spanish     - Is the patient high risk? No    - Does the patient require physician intervention or other additional services (i.e. dietary/nutrition, smoking cessation, social work)? No      Francisco Ortiz  [facility] Specialty Pharmacist

## 2020-10-09 NOTE — Unmapped (Signed)
-   Me preocupa que tenga una infecci??n por hongos adem??s de la infecci??n bacterial.    - Contin??e tomando Bactrim dos veces al dia hasta que termine todas las p??ldoras.    -Comience Diflucan 1 pastilla una vez al d??a durante la pr??xima semana.  - Puede aplicar una crema antimic??tica (Lotrimin) en las ??reas rojas dos veces al d??a durante las pr??ximas 2 semanas.  - Si tiene fiebre, escalofr??os, empeoramiento del dolor, m??s enrojecimiento, vaya a la sala de emergencias.    -Reinicie sus medicamentos contra el VIH    - Por favor regrese en 1 semana para una cita de sigimiento

## 2020-10-09 NOTE — Unmapped (Signed)
Name: Francisco Ortiz  Date: 10/09/2020  Address: [address]  [place] [address]   [place] of Residence:  [facility]  Phone: [phone]     Started assessment with patient options: in clinic     Is this the same address for mailing? Yes  If No, Mailing Address is:     Housing Status  Temporary    Insurance  No Insurance    Tax Press photographer Status  I did not file taxes     Employment Status  Unemployed    Income  No Household Income/Deductions of any kind    If no or low income, how are you meeting your basic needs?  Family Support and Other, specify: savings    List Tax Household Members including relationship to you:   n/a    Someone in my household receives: Not Applicable (for household members)  Specify who: n/a     Do you have a current diagnosis for Hepatitis C?  Lab Results   Component Value Date    HEPCAB Negative 03/26/2014       Have you used tobacco products four or more times per week in the last six months?  No    Federal Marketplace Eligibility Assessment  Patient may be eligible for ACA coverage. Do you have legal status in the [place]? No - patient is not eligible for PCAP/ACA.     Patient given ACA education if they qualified based on answers to questions above.     Patient was informed of the following programs;   [employer]    The following applications/handouts were given to patient:   [facility]    The following forms were also started with the patient:   N/A    Juanell Fairly application status: Complete    Patient is applying for HMAP - UMAP (uninsured - medication assistance)    Additional Comments: n/a    Patient completed [employer] application. Eligible for RW B&C grant services and Caps on Charges. IPL= 0%, FPL= 0%. Expires 10/08/21    RW Eligibility Form informing patient about RW services and Caps on charges was sent to patient via Mail    HMAP/UMAP application: Application was submitted via secure email today.           Bradly Bienenstock LCSW, CHES  [facility] Lead Social Worker  Direct: [phone]  Main ID: [phone]

## 2020-10-09 NOTE — Unmapped (Signed)
INFECTIOUS DISEASES CLINIC  [address]  [place], [place]  [address]  P [phone]  F [phone]     Assessment/Plan:      HIV (dx'd 2009, nadir CD4 18 as of July 2022)     Mr. Francisco Ortiz was last seen 09/2018 and has been off of ART for 2 years. He was recently admitted to [facility] for a right inguinal abscess and found to have a CD4 count of 18 and VL of 262,000.     - His previous ART regimen was: Darunavir 600mg  twice daily, Norvir 100mg  twice daily, Descovy (FTC/TAF) 200-25mg  once daily. He has not restarted ART.    - His past genotyping shows the following mutations:  PI- No significant mutations that I see documented  NRTI- No significant mutations that I see documented  NNRTI- 179D- potential low level resistance to all NNRIs aside from doravirine  INSTI- 138k, 140S, 148R- High level resistance to all medications in class    Will restart him on his prior regimen today. I am not sure that he needs to be on twice daily Prezista/norvir, as he doesn't have any documented resistance I can find, so can consider change to STR of Symtuza. I would avoid NNRTIs due to lower barrier to resistance and his history of poor adherence.    He will need OI prophylaxis. Currently on Bactrim for abscess treatment, but due to new hyponatremia will hold Bactrim and start patient on Atovaquone. May be able to resume Bactrim if not the cause of hyponatremia.     We discussed his severe immunosuppression and risks of infection, cancers and additional poor outcomes and stressed importance of restarting medication and maintaining adherence.     Follow-up 1 week.    Abscess of groin, right  - Hospitalized 7/13-7/15 for right inguinal abscess. Currently on Bactrim 1 DS tab twice daily. Culture grew MSSA (S: Clinda, Tetracycline, Trimeth/Sulfa).  - Healing well on exam today. Minimal discharge, no pain or areas of fluctuance.  - He has an area of erythema in his inguinal folds bilaterally that extends to right thigh. Scalloped border, non- tender, not warm to the touch. Suspect this could be fungal, so will treat with Diflucan 150mg  x 1 week and topical clotrimazole.   - Change from Bactrim to Doxycyline due to new hyponatremia.  - He is scheduled for follow-up with his surgeon next Tuesday.    - Multiple interactions between medications and Percocet- He is not having pain, so advised he not use percocet once he starts HIV medicine.     Hyponatremia  - Labs today resulted with Na of 124 (127 when corrected for hyperglyemica). Sodium ranged from 127-134 while admitted. He is asymptomatic.   - Will add on Serum osmolality. Plan to add urine osm and urine sodium at next visit.  - Hold Bactrim as has been associated with hyponatremia.  - Discussed ER precautions.     Type 2 diabetes mellitus without complication, without long-term current use of insulin (CMS-HCC)  - Has been off of medications for ~2 years.   - HgA1C returned at 12.6% today.  - Recently restarted on Metformin 850 BID while hospitalized.  - Previously on Glipizide- not addressed today  - Hold atorvastatin for now as he's restarting multiple medications.    Immunization History   Administered Date(s) Administered   ??? COVID-19 VACC,MRNA,(PFIZER)(PF)(IM) 10/09/2020   ??? Hepatitis B, Adult 09/20/2011, 01/10/2012, 05/01/2012   ??? INFLUENZA TIV (TRI) PF (IM) 01/09/2010   ???  Influenza Vaccine Quad (IIV4 PF) 1mo+ injectable 01/10/2012, 03/26/2014, 01/13/2016   ??? PNEUMOCOCCAL POLYSACCHARIDE 23 09/20/2011   ??? PPD Test 03/10/2009, 08/01/2009   ??? TdaP 09/20/2011     ??? Immunizations today - COVID    I personally spent 50 minutes face-to-face and non-face-to-face in the care of this patient, which includes all pre, intra, and post visit time on the date of service.    Disposition    Next appointment: 1 week    Lahoma Rocker, PA-C  [facility]   [address]  [place], [place].  [address]  Phone: [phone]   Fax: [phone]      Subjective      Chief Complaint HIV  Hospital Follow-up    HPI  In addition to details in A&P above:    Mr. Francisco Ortiz is a 50 yo man with a history of advanced HIV, CMV duodenitis, L inguinal hernia, T2DM who has been off ART for 2 years and presents to re-establish care and for hospital follow-up after recent admission for sepsis 2/2 R inguinal abscess.    He reports he had some swelling and pain in right thigh- went to ER and was admitted for a surgery to fix the infection. He denies prior injury to the area- not sure how it developed. It developed 1 day before going to the ER. Also had fever, chills.    Per hospital discharge summary, he was admitted at [facility] 7/13-7/15 for sepsis 2/2 R inguinal abscess. He underwent I&D and was started on IV Zosyn which was changed to Bactrim BID x 10 days upon discharge. Blood cultures were negative x 2. Wound culture grew GPC.    Today he reports the pain has resolved and he no longer has any fever, chills, myalgias. His concern is that he now has a erythematous area around the site of the abscess. It is not painful or warm. It is worsening since discharge.  It is a little itchy/burns a bit when he washes it.    He has been off of ART for 2 years. He had a friend from work who brought him to visits but she died and so he hasn't been back. He lives in [place]- with 2 friends. 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     His VL was 262000 and CD4 was 18/1.8% He has had weight loss. He denies diarrhea, n/v, abdominal pain. He denies cough, dyspnea, chest pain. No headaches.    Past Medical History:   Diagnosis Date   ??? CMV (cytomegalovirus infection) (CMS-HCC) 2010    duodenitis   ??? History of high cholesterol    ??? HIV (human immunodeficiency virus infection) (CMS-HCC)    ??? Hypertension    ??? Kidney stone    ??? Nephrolithiasis 2009     Social History    General: Lives with friends, employed in a factory packing shampoo. Does some exercise, mainly running. Born in [place], no recent travel.  ??  Sexual History:  Sex with women.  ??  Substance Use: No alcohol, tobacco, or drugs  ??  Review of Systems  As per HPI. All others negative.    Medications and Allergies  He has a current medication list which includes the following prescription(s): blood sugar diagnostic, glipizide, lancets, lisinopril, atorvastatin, atovaquone, blood-glucose meter, clotrimazole, darunavir ethanolate, doxycycline, descovy, fluconazole, ibuprofen, metformin, oxycodone-acetaminophen, and ritonavir.    Allergies: Iohexol, Morphine, and Vancomycin    Family History  His  family history includes Diabetes in his mother and sister; Migraines in an other family member.     Objective      BP 130/88  - Pulse 85  - Temp 36.7 ??C (98.1 ??F)  - Wt 70.1 kg (154 lb 9.6 oz)  - BMI 26.54 kg/m??      Physical Exam  Vitals reviewed.   Constitutional:       Appearance: Normal appearance. He is not ill-appearing or diaphoretic.   Cardiovascular:      Rate and Rhythm: Normal rate and regular rhythm.   Pulmonary:      Effort: Pulmonary effort is normal.      Breath sounds: Normal breath sounds.   Skin:         Neurological:      Mental Status: He is alert.   Psychiatric:         Mood and Affect: Mood normal.         Behavior: Behavior normal.         Thought Content: Thought content normal.         Judgment: Judgment normal.

## 2020-10-10 MED ORDER — ATOVAQUONE 750 MG/5 ML ORAL SUSPENSION
Freq: Every day | ORAL | 5 refills | 30 days | Status: CP
Start: 2020-10-10 — End: ?
  Filled 2020-10-10: qty 300, 30d supply, fill #0

## 2020-10-10 NOTE — Unmapped (Signed)
[facility]   Patient Onboarding/Medication Counseling    Francisco Ortiz is a 50 y.o. male with HIV with the need for opportunistic infection prophylaxis who I am counseling today on initiation of therapy.  I am speaking to the patient.    Was a Nurse, learning disability used for this call? Yes, Spanish Conservation officer, historic buildings # [phone]). Patient language is appropriate in WAM    Verified patient's date of birth / HIPAA.    Specialty medication(s) to be sent: Infectious Disease: Atovaquone suspension      Non-specialty medications/supplies to be sent: n/a      Medications not needed at this time: n/a         Mepron (atovaquone)  750mg /5 mL suspension    Medication & Administration     Dosage: Take 10 mL (1500mg ) by mouth once daily    Administration:   ??? Shake gently before use  ??? Take with food  ??? Measure liquid carefully using measuring device provided with drug    Adherence/Missed dose instructions:  ??? Take a missed dose as soon as you think about it  ??? If it is close to the time for next dose, skip missed dose and resume normal schedule  ??? Do not take 2 doses at the same time or extra doses    Goals of Therapy     ??? To prevent Pneumocystis jirovecii pneumonia (PJP) and toxoplasmosis    Side Effects & Monitoring Parameters     ??? Common side effects  ??? Headache  ??? Nausea, vomiting, diarrhea, abdominal pain  ??? Skin rash  ??? Trouble sleeping  ??? Muscle pain or ache  ??? Flu-like symptoms- Stuffy or runny nose, cough, fever    ??? The following side effects should be reported to the provider:  ??? Allergic reaction  ??? Signs of infection  ??? Depression  ??? Thrush  ??? Dark urine, fatigue, lack of appetite, abdominal pain, light colored stool, vomiting, yellow skin/eyes    ??? Monitoring Parameters  ??? Hepatic function tests     Contraindications, Warnings, & Precautions     ??? Use caution in patients with severe hepatic impairment  ??? Use caution in elderly patients    Drug/Food Interactions     ??? Medication list reviewed in Epic. The patient was instructed to inform the care team before taking any new medications or supplements. Drug interaction.  o Ritonavir: ritonavir can lower the effectiveness of his atovaquone. However, until his G6PD labs results are available, it is the only option for opportunistic infection prophylaxis.    Storage, Handling Precautions, & Disposal     ??? Store at room temperature in dry location  ??? Keep away from children and pets      Current Medications (including OTC/herbals), Comorbidities and Allergies     Current Outpatient Medications   Medication Sig Dispense Refill   ??? atorvastatin (LIPITOR) 20 MG tablet Take 1 tablet (20 mg total) by mouth daily. 30 tablet 2   ??? atovaquone (MEPRON) 750 mg/5 mL suspension Take 10 mL (1,500 mg total) by mouth daily. 300 mL 5   ??? blood sugar diagnostic (FREESTYLE TEST) Strp by Other route every morning. 30 strip 5   ??? blood-glucose meter (GLUCOSE MONITORING KIT) kit Use as instructed 1 each 0   ??? clotrimazole (LOTRIMIN) 1 % cream Apply topically Two (2) times a day. 30 g 0   ??? darunavir ethanolate (PREZISTA) 600 MG tablet Take 1 tablet (600 mg total) by mouth two (2)  times a day. 60 tablet 2   ??? doxycycline (VIBRA-TABS) 100 MG tablet Take 1 tablet (100 mg total) by mouth Two (2) times a day. 20 tablet 0   ??? emtricitabine-tenofovir alafen (DESCOVY) 200-25 mg tablet Take 1 tablet by mouth daily. 30 tablet 2   ??? fluconazole (DIFLUCAN) 150 MG tablet Take 1 tablet (150 mg total) by mouth daily. 7 tablet 0   ??? glipiZIDE (GLUCOTROL) 5 MG tablet TAKE 1 TABLET(5 MG) BY MOUTH TWICE DAILY 30 MINUTES BEFORE A MEAL 180 tablet 3   ??? ibuprofen (MOTRIN) 600 MG tablet Take 1 tablet (600 mg total) by mouth every eight (8) hours as needed for pain. 100 tablet 0   ??? lancets Misc 1 Units by Miscellaneous route every morning. 30 each 5   ??? lisinopriL (PRINIVIL,ZESTRIL) 5 MG tablet Take 1 tablet (5 mg total) by mouth daily. TAKE 1 TABLET(5 MG) BY MOUTH DAILY 30 tablet 0   ??? metFORMIN (GLUCOPHAGE) 850 MG tablet Take 1 tablet (850 mg total) by mouth 2 (two) times a day with meals. 180 tablet 0   ??? oxyCODONE-acetaminophen (PERCOCET) 5-325 mg per tablet TAKE 1 TABLET BY MOUTH EVERY 8 HOURS AS NEEDED FOR MODERATE PAIN     ??? riTONAvir (NORVIR) 100 mg tablet Take 1 tablet (100 mg total) by mouth Two (2) times a day. 60 tablet 2     No current facility-administered medications for this visit.       Allergies   Allergen Reactions   ??? Iohexol      Other reaction(s): SHORTNESS OF BREATH   ??? Morphine      Other reaction(s): HIVES   ??? Vancomycin      Other reaction(s): HIVES       Patient Active Problem List   Diagnosis   ??? HIV disease (CMS-HCC)   ??? Lower urinary tract infectious disease   ??? Nephrolithiasis   ??? CMV duodenitis (CMS-HCC)   ??? Congenital hiatus hernia   ??? Type 2 diabetes mellitus without complication (CMS-HCC)       Reviewed and up to date in Epic.    Appropriateness of Therapy     Acute infections noted within Epic:  No active infections  Patient reported infection: None    Is medication and dose appropriate based on diagnosis and infection status? Yes    Prescription has been clinically reviewed: Yes      Baseline Quality of Life Assessment      How many days over the past month did your HIV with the need for opportunistic infection prophylaxis  keep you from your normal activities? For example, brushing your teeth or getting up in the morning. 0    Financial Information     Medication Assistance provided: None Required    Anticipated copay of $0.00 reviewed with patient. Verified delivery address.    Delivery Information     Scheduled delivery date: 10/13/20    Expected start date: 10/13/20    Medication will be delivered via UPS to the temporary address in [facility].  This shipment will not require a signature.      Explained the services we provide at [facility] and that each month we would call to set up refills.  Stressed importance of returning phone calls so that we could ensure they receive their medications in time each month.  Informed patient that we should be setting up refills 7-10 days prior to when they will run out of medication.  A pharmacist will reach out to perform a clinical assessment periodically.  Informed patient that a welcome packet, containing information about our pharmacy and other support services, a Notice of Privacy Practices, and a drug information handout will be sent.      The patient or caregiver noted above participated in the development of this care plan and knows that they can request review of or adjustments to the care plan at any time.      Patient or caregiver verbalized understanding of the above information as well as how to contact the pharmacy at [phone] option 4 with any questions/concerns.  The pharmacy is open Monday through Friday 8:30am-4:30pm.  A pharmacist is available 24/7 via pager to answer any clinical questions they may have.    Patient Specific Needs     - Does the patient have any physical, cognitive, or cultural barriers? No    - Does the patient have adequate living arrangements? (i.e. the ability to store and take their medication appropriately) Yes    - Did you identify any home environmental safety or security hazards? No    - Patient prefers to have medications discussed with  Patient     - Is the patient or caregiver able to read and understand education materials at a high school level or above? Yes    - Patient's primary language is  Spanish     - Is the patient high risk? No    - Does the patient require physician intervention or other additional services (i.e. dietary/nutrition, smoking cessation, social work)? No      Roderic Palau  [facility] Specialty Pharmacist

## 2020-10-14 ENCOUNTER — Ambulatory Visit: Admit: 2020-10-14 | Discharge: 2020-10-15

## 2020-10-14 ENCOUNTER — Telehealth: Payer: Self-pay | Admitting: Pharmacy Technician

## 2020-10-14 ENCOUNTER — Encounter: Payer: Self-pay | Admitting: Infectious Diseases

## 2020-10-14 ENCOUNTER — Encounter: Payer: Self-pay | Admitting: Surgery

## 2020-10-14 ENCOUNTER — Other Ambulatory Visit: Payer: Self-pay

## 2020-10-14 ENCOUNTER — Ambulatory Visit: Payer: Self-pay | Attending: Infectious Diseases | Admitting: Infectious Diseases

## 2020-10-14 VITALS — BP 142/91 | HR 89 | Temp 98.1°F | Resp 16 | Wt 158.0 lb

## 2020-10-14 DIAGNOSIS — B20 Human immunodeficiency virus [HIV] disease: Secondary | ICD-10-CM

## 2020-10-14 DIAGNOSIS — Z7984 Long term (current) use of oral hypoglycemic drugs: Secondary | ICD-10-CM | POA: Insufficient documentation

## 2020-10-14 DIAGNOSIS — L304 Erythema intertrigo: Secondary | ICD-10-CM | POA: Insufficient documentation

## 2020-10-14 DIAGNOSIS — E119 Type 2 diabetes mellitus without complications: Secondary | ICD-10-CM | POA: Insufficient documentation

## 2020-10-14 DIAGNOSIS — Z885 Allergy status to narcotic agent status: Secondary | ICD-10-CM | POA: Insufficient documentation

## 2020-10-14 DIAGNOSIS — Z881 Allergy status to other antibiotic agents status: Secondary | ICD-10-CM | POA: Insufficient documentation

## 2020-10-14 DIAGNOSIS — L0291 Cutaneous abscess, unspecified: Secondary | ICD-10-CM

## 2020-10-14 DIAGNOSIS — A4902 Methicillin resistant Staphylococcus aureus infection, unspecified site: Secondary | ICD-10-CM

## 2020-10-14 DIAGNOSIS — Z79899 Other long term (current) drug therapy: Secondary | ICD-10-CM | POA: Insufficient documentation

## 2020-10-14 DIAGNOSIS — E871 Hypo-osmolality and hyponatremia: Principal | ICD-10-CM

## 2020-10-14 DIAGNOSIS — L989 Disorder of the skin and subcutaneous tissue, unspecified: Principal | ICD-10-CM

## 2020-10-14 DIAGNOSIS — L02214 Cutaneous abscess of groin: Principal | ICD-10-CM

## 2020-10-14 MED ORDER — SYMTUZA 800 MG-150 MG-200 MG-10 MG TABLET
ORAL_TABLET | Freq: Every day | ORAL | 11 refills | 0.00000 days | Status: CP
Start: 2020-10-14 — End: ?

## 2020-10-14 MED ORDER — TRIAMCINOLONE ACETONIDE 0.1 % TOPICAL CREAM
Freq: Two times a day (BID) | TOPICAL | 0 refills | 0 days | Status: CP
Start: 2020-10-14 — End: 2021-10-14

## 2020-10-14 NOTE — Telephone Encounter (Signed)
Patient received a 30 day supply of metformin.  Provided patient with new patient packet in Spanish to obtain ongoing Medication Management Clinic services.  [facility] must receive requested financial documentation within 30 days in order to determine eligibility and provide additional medication assistance.  Sherilyn Dacosta Care Manager Medication Management Clinic

## 2020-10-14 NOTE — Progress Notes (Signed)
NAME: [name]  DOB: [birth year]  MRN: [id number]  Date/Time: 10/14/2020 9:45 AM   Subjective:   ?Spanish interpreter in the room [name] is a 50 y.o. male with a history of AIDS DM was recently in the hospital between 10/01/20-10/03/20 for a rt inguinal/femoral abscess- He underwent I/D and MRSA was cultured and he got IV vanco /linezolid and sent home on Doxy. PT also has a history of HIV and was in [facility] until July 2020 when he stopped . He had stopping taking HAART  descovy, prezista and Norviraround that time. His nadir Cd4 when diagnosed was 92 on 05/11/2011.  His Cd4 in 2019 was more than 700 and Vl < 20. During his recent hospitalization Vl was 262K on 10/02/20 and Cd4 was 18. I contacted [facility] and he is engaged in care now- Saw them on 10/09/20 and his HIV provider Lahoma Rocker spoke to me and I faxed all his records. He has been restarted on HIV meds He was also given fluconazole for yeast infection in the groin He is feeling better He is yet to see the surgeon and has an appt on 10/16/20 He has no fever or chills He says he is taking all his meds and is walking better. He is staying with his niece in [place] and she is not aware of his status  Past Medical History:  Diagnosis Date   Diabetes mellitus without complication (HCC)    HIV (human immunodeficiency virus infection) (HCC)    Kidney stone    Left inguinal hernia     Past Surgical History:  Procedure Laterality Date   APPENDECTOMY     INCISION AND DRAINAGE ABSCESS Right 10/01/2020   Procedure: INCISION AND DRAINAGE ABSCESS;  Surgeon: Campbell Lerner, MD;  Location: [facility] ORS;  Service: General;  Laterality: Right;   kidney stone sugery Left     Social History   Socioeconomic History   Marital status: Married    Spouse name: Not on file   Number of children: Not on file   Years of education: Not on file   Highest education level: Not on file  Occupational History   Not on file  Tobacco Use   Smoking  status: Never   Smokeless tobacco: Never  Substance and Sexual Activity   Alcohol use: Not Currently   Drug use: Never   Sexual activity: Not on file  Other Topics Concern   Not on file  Social History Narrative   ** Merged History Encounter **       Social Determinants of Health   Financial Resource Strain: Not on file  Food Insecurity: Not on file  Transportation Needs: Not on file  Physical Activity: Not on file  Stress: Not on file  Social Connections: Not on file  Intimate Partner Violence: Not on file    Family History  Problem Relation Age of Onset   Diabetes Mellitus II Mother    Allergies  Allergen Reactions   Iohexol     Other reaction(s): SHORTNESS OF BREATH   Morphine     Other reaction(s): HIVES   Vancomycin     Other reaction(s): HIVES   Morphine And Related Rash   I? Current Outpatient Medications  Medication Sig Dispense Refill   clotrimazole (LOTRIMIN) 1 % cream Apply topically.     darunavir (PREZISTA) 600 MG tablet Take by mouth.     doxycycline (VIBRA-TABS) 100 MG tablet Take 100 mg by mouth 2 (two) times daily.  emtricitabine-tenofovir AF (DESCOVY) 200-25 MG tablet Take 1 tablet by mouth daily.     fluconazole (DIFLUCAN) 150 MG tablet Take 1 tablet by mouth daily.     ibuprofen (ADVIL) 600 MG tablet Take by mouth.     lisinopril (ZESTRIL) 5 MG tablet Take by mouth.     metFORMIN (GLUCOPHAGE) 850 MG tablet Take 1 tablet (850 mg total) by mouth 2 (two) times daily with a meal. 60 tablet 1   oxyCODONE-acetaminophen (PERCOCET/ROXICET) 5-325 MG tablet Take 1 tablet by mouth every 8 (eight) hours as needed for moderate pain. 10 tablet 0   ritonavir (NORVIR) 100 MG TABS tablet Take by mouth.     No current facility-administered medications for this visit.     Abtx:  Anti-infectives (From admission, onward)    None       REVIEW OF SYSTEMS:  Const: negative fever, negative chills,++ weight loss Eyes: negative diplopia or visual changes,  negative eye pain ENT: negative coryza, negative sore throat Resp: negative cough, hemoptysis, dyspnea Cards: negative for chest pain, palpitations, lower extremity edema GU: negative for frequency, dysuria and hematuria GI: Negative for abdominal pain, diarrhea, bleeding, constipation Skin: negative for rash and pruritus Heme: negative for easy bruising and gum/nose bleeding MS: fatigue and weakness Neurolo:negative for headaches, dizziness, vertigo, memory problems  Psych: negative for feelings of anxiety, depression  Endocrine:  diabetes Allergy/Immunology- as above ?  Objective:  VITALS:  BP (!) 142/91   Pulse 89   Temp 98.1 F (36.7 C)   Resp 16   Wt 158 lb (71.7 kg)   SpO2 99%   BMI 28.90 kg/m  PHYSICAL EXAM:  General: Alert, cooperative, no distress, appears stated age.  Head: Normocephalic, without obvious abnormality, atraumatic. Eyes: Conjunctivae clear, anicteric sclerae. Pupils are equal ENT Nares normal. No drainage or sinus tenderness. Lips, mucosa, and tongue normal. No Thrush Neck: Supple, symmetrical, no adenopathy, thyroid: non tender no carotid bruit and no JVD. Back: No CVA tenderness. Lungs: Clear to auscultation bilaterally. No Wheezing or Rhonchi. No rales. Heart: Regular rate and rhythm, no murmur, rub or gallop. Abdomen: Soft, non-tender,not distended. Bowel sounds normal. No masses Extremities: rt inner thigh- I/D site is packed- surrounding erythema and induration much improved Some intertrigo Also has swelling of his scrotum, left inguinal hernia  Skin:eczematous changes  excoriations and pigmentation legs Lymph: Cervical, supraclavicular normal. Neurologic: Grossly non-focal Pertinent Labs  10/09/20 WBC 5.5, HB 14.3 and PLT 267 Cd 4< 10 HIV RNA 140K Na 124, K 5.3, cr 1.19    Impression/Recommendation ? ?Pt with AIDS- restarted on prezista, norvir and descovy at [facility] on mepron  last Vl 140K and cd4 , 10 Re-engaged in care on  10/09/20 He will follow up there  MRSA abscess rt upper femoral area/inner thigh- s/p I/D by Dr.Rodenberg - he is on Doxy- He has afollow up appt with Dr.Rodenberg on 10/16/20 Informed patient about that  Intertrigo on fluconazole until 10/16/20  DM- restarted metformin   Discussed the management with the patient- informed him that he has to be adherent to his medication and visits ?  Pt is discharged from my care He will follow up at [facility] _________________________________________________  Note:  This document was prepared using Dragon voice recognition software and may include unintentional dictation errors.

## 2020-10-14 NOTE — Patient Instructions (Addendum)
You are here after recent discharge from the hospital,. You are now engaged in care in [facility] and they are treating you for the infection- follow up with the surgeon Dr.Rodenberg on Thursday . His address is [phone]   [address]   [address]   [place] [address]      Specialties     General Surgery

## 2020-10-14 NOTE — Unmapped (Signed)
INFECTIOUS DISEASES CLINIC  [address]  [place], [place]  [address]  P [phone]  F [phone]     Assessment/Plan:      HIV (dx'd 2009, nadir CD4 18 as of July 2022)     - Francisco Ortiz was out of care from 09/2018 until recent hospitalization at [facility]. He has been off of ART for 2 years. His CD4 count is <10 and his viral load is 140,894. He restarted Darunavir 600mg  twice daily, Norvir 100mg  twice daily, Descovy (FTC/TAF) 200-25mg  once daily yesterday.  - His past genotyping shows the following mutations:  PI- No significant mutations documented  NRTI- No significant mutations that I see documented  NNRTI- 179D- potential low level resistance to all NNRIs aside from doravirine  INSTI- 138k, 140S, 148R- High level resistance to all medications in class  - No documented resistance to PIs, including darunavir, so will simplify his ART regimen to Symtuza (DRV/cobi/FTC/TAF) today for ease of dosing and hopefully to improve long term adherence.  - Continue Atovaquone for OI prophylaxis.  - Return in 3-4 weeks to check HIV RNA and genotyping.     Abscess of groin, right  - Hospitalized 7/13-7/15 for right inguinal abscess. Culture grew MSSA (S: Clinda, Tetracycline, Trimeth/Sulfa).  - Healing well on exam today. Scheduled to see surgery for follow-up 7/28.   - Currently taking Doxycyline 100mg  BID to complete 10 day course  - Area of erythema in bilateral inguinal folds concerning for tinea cruris vs intertrigo. Currently on Diflucan and clotrimazole with improvement in rash.    - He is scheduled for follow-up with his surgeon next Tuesday.  - Multiple interactions between medications and Percocet- He is not having pain, so advised he not use percocet once he starts HIV medicine.     Hyponatremia  - Moderate, asymptomatic hyponatremia at last visit (Na of 127 when corrected for hyperglyemica). Normal serum osmolality, so may be pseudohyponatremia.  - Repeat BMP today along with serum osmolality, urine sodium and urine osmolality, TSH, lipid panel.  - Follow-up pending labs today. Consider e-consult to nephrology for additional input.     Type 2 diabetes mellitus without complication, without long-term current use of insulin (CMS-HCC)  - Has been off of medications for ~2 years.   - HgA1C returned at 12.6% .  - Recently restarted on Metformin 850 BID while hospitalized.  - Previously on Glipizide- not addressed today   - He is in need of a PCP- He would like to come to [facility]- will place referral and reach out to [facility] clinic.     Skin lesion of right leg  - 6+ month history of pruritic nodular rash of right calf/shin. He denies any pain or discharge. Pictures in media tab.  - With his severe immunosuppression, I am concerned about possible KS, although not necessarily the typical presentation. 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 He will need close follow-up with Dermatology- urgent referral placed today and I called to request they see him within the next 1-2 weeks.   -     Ambulatory referral to Dermatology; Future    Skin lesion of hand  - 6 month history of dry, pruritic changes on left hand with lichenification and some fissures. 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 Pictures in media tab.  - Concern for contact dermatitis, so advised to trial small amount of triamcinolone on hand to see if he has  improvement in symptoms.  - Advised wearing gloves when possible at work or at home when using chemicals/irritants.  - Referral to Dermatology.  -     Ambulatory referral to Dermatology; Future    Immunization History   Administered Date(s) Administered   ??? COVID-19 VACC,MRNA,(PFIZER)(PF)(IM) 10/09/2020   ??? Hepatitis B, Adult 09/20/2011, 01/10/2012, 05/01/2012   ??? INFLUENZA TIV (TRI) PF (IM) 01/09/2010   ??? Influenza Vaccine Quad (IIV4 PF) 85mo+ injectable 01/10/2012, 03/26/2014, 01/13/2016   ??? PNEUMOCOCCAL POLYSACCHARIDE 23 09/20/2011   ??? PPD Test 03/10/2009, 08/01/2009   ??? TdaP 09/20/2011     I personally spent 45 minutes face-to-face and non-face-to-face in the care of this patient, which includes all pre, intra, and post visit time on the date of service.    Disposition    Next appointment: 3-4 weeks    Lahoma Rocker, PA-C  [facility] Infectious Diseases [facility]   [address]  [place], [place].  [address]  Phone: [phone]   Fax: [phone]      Subjective      Chief Complaint   HIV  Hospital Follow-up    HPI  In addition to details in A&P above:  Initial Visit 10/09/20  Francisco Ortiz is a 50 yo man with a history of advanced HIV, CMV duodenitis, L inguinal hernia, T2DM who has been off ART for 2 years and presents to re-establish care and for hospital follow-up after recent admission for sepsis 2/2 R inguinal abscess.    He reports he had some swelling and pain in right thigh- went to ER and was admitted for a surgery to fix the infection. He denies prior injury to the area- not sure how it developed. It developed 1 day before going to the ER. Also had fever, chills.    Per hospital discharge summary, he was admitted at [facility] 7/13-7/15 for sepsis 2/2 R inguinal abscess. He underwent I&D and was started on IV Zosyn which was changed to Bactrim BID x 10 days upon discharge. Blood cultures were negative x 2. Wound culture grew GPC.    Today he reports the pain has resolved and he no longer has any fever, chills, myalgias. His concern is that he now has a erythematous area around the site of the abscess. It is not painful or warm. It is worsening since discharge.  It is a little itchy/burns a bit when he washes it.    He has been off of ART for 2 years. He had a friend from work who brought him to visits but she died and so he hasn't been back. He lives in [place]- with 2 friends. 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     His VL was 262000 and CD4 was 18/1.8% He has had weight loss. He denies diarrhea, n/v, abdominal pain. He denies cough, dyspnea, chest pain. No headaches.    10/14/20    Since his last visit, he started Prezista, Norvir and Descovy (yesterday). He's also taking Atovaquone daily, Doxycyline BID, Metformin BID, Fluconazole daily and using clotrimazole cream twice daily. He stopped Bactrim and hasn't started his statin.   He is feeling well today, without any specific concerns. He denies any fever, chills, nausea, vomiting, confusion. He denies any changes in urination- he voids 6x/day and twice at night. He is drinking around 8 bottles of water daily, which is normal for him. He also drinks 1 small glass of juice. No soda, no alcohol.   His appetite  is normal and he is eating regularly.    He feels the rash in his groin is improving. The abscess continues to heal- he's changing the dressing daily. He isn't having any pain in the area, any increased erythema or any increase in discharge.    He has had an itchy, nodular rash on his right calf/lower leg for around 6 months. It isn't painful. He hasn't tried any treatments for it.    He also has an itchy, rough rash on his left hand, worse on the dorsal aspect. He thinks it started when he was plumbing and it was exposed to dirty water. 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 He hasn't tried any topical treatments. His right hand doesn't have any rash.     Past Medical History:   Diagnosis Date   ??? CMV (cytomegalovirus infection) (CMS-HCC) 2010    duodenitis   ??? History of high cholesterol    ??? HIV (human immunodeficiency virus infection) (CMS-HCC)    ??? Hypertension    ??? Kidney stone    ??? Nephrolithiasis 2009     Social History    General: Lives with friends, employed in a factory packing shampoo. Does some exercise, mainly running. Born in [place], no recent travel.  ??  Sexual History:  Sex with women.  ??  Substance Use: No alcohol, tobacco, or drugs  ??  Review of Systems  As per HPI. All others negative.    Medications and Allergies  He has a current medication list which includes the following prescription(s): atorvastatin, atovaquone, blood sugar diagnostic, blood-glucose meter, clotrimazole, darunavir ethanolate, doxycycline, descovy, fluconazole, glipizide, ibuprofen, lancets, lisinopril, metformin, oxycodone-acetaminophen, and ritonavir.    Allergies: Iohexol, Morphine, and Vancomycin    Family History  His family history includes Diabetes in his mother and sister; Migraines in an other family member.     Objective      There were no vitals taken for this visit.     Physical Exam  Vitals reviewed.   Constitutional:       Appearance: Normal appearance. He is not ill-appearing or diaphoretic.   Cardiovascular:      Rate and Rhythm: Normal rate and regular rhythm.   Pulmonary:      Effort: Pulmonary effort is normal.      Breath sounds: Normal breath sounds.   Skin:         Neurological:      Mental Status: He is alert.   Psychiatric:         Mood and Affect: Mood normal.         Behavior: Behavior normal.         Thought Content: Thought content normal.         Judgment: Judgment normal.

## 2020-10-14 NOTE — Unmapped (Addendum)
Me gustar??a Statistician el VIH por un nuevo medicamento llamado Symtuza. Enviar?? esto a su farmacia. De momento siguen Prezista, norvir y Research officer, political party. Cuando [place], puede DETENER Prezista, norvir y Research officer, political party y solo tomar Symtuza una vez al d??a.    Contin??e con atovacuona todos los d??as    Contin??e la crema sobre la erupci??n dos veces al d??a.    Continue Metformin dos veces al dia.     [address]  [place], [place] [address]   Main????????????[phone]     Dr. Ephraim Hamburger  NP Regina Eck

## 2020-10-15 DIAGNOSIS — E871 Hypo-osmolality and hyponatremia: Principal | ICD-10-CM

## 2020-10-16 ENCOUNTER — Other Ambulatory Visit: Payer: Self-pay

## 2020-10-16 ENCOUNTER — Encounter: Payer: Self-pay | Admitting: Surgery

## 2020-10-16 ENCOUNTER — Ambulatory Visit (INDEPENDENT_AMBULATORY_CARE_PROVIDER_SITE_OTHER): Payer: Self-pay | Admitting: Surgery

## 2020-10-16 VITALS — BP 135/96 | HR 92 | Temp 98.8°F | Ht 62.0 in | Wt 151.0 lb

## 2020-10-16 DIAGNOSIS — L02419 Cutaneous abscess of limb, unspecified: Secondary | ICD-10-CM

## 2020-10-16 DIAGNOSIS — L02214 Cutaneous abscess of groin: Secondary | ICD-10-CM

## 2020-10-16 DIAGNOSIS — L03119 Cellulitis of unspecified part of limb: Secondary | ICD-10-CM

## 2020-10-16 LAB — GENOSURE PRIME (GSPRIL)

## 2020-10-16 NOTE — Progress Notes (Signed)
[facility] POST-OP OFFICE VISIT  10/16/2020  HPI: [name] is a 50 y.o. male 15 days s/p incision and drainage of left groin abscess.  Cultures came back for staph aureus that was sensitive to all antibiotics.  He has follow-up regarding his diabetes and his current blood sugars are about 250 in the morning and 300 in the afternoon.  He has getting his dressing changed on a daily basis, reports his pain is improving.  And denies nausea vomiting, fevers and chills.  Bowel movements are regular and his left groin hernia is not causing him any discomfort at this time.  Vital signs: BP (!) 135/96   Pulse 92   Temp 98.8 F (37.1 C)   Ht 5\' 2"  (1.575 m)   Wt 151 lb (68.5 kg)   SpO2 98%   BMI 27.62 kg/m    Physical Exam: Constitutional: He appears well, conversant with [name] the interpreter. Abdomen: Soft and benign, nontender. Skin: Well-healed granulating wound in the right groin.  100% granulated, without pus, discharge or evidence of tracking or tunneling.  Diminishing induration surrounding the wound.  Topical cream, treating the intertriginous candidiasis.  Markedly improved from my last evaluation.  Assessment/Plan: This is a 50 y.o. male 15 days s/p incision and drainage of right groin abscess.  He continues to take his antibiotics for sensitive staph aureus.  Patient Active Problem List   Diagnosis Date Noted   Inguinal abscess 10/01/2020   Sepsis (HCC) 10/01/2020   Diabetes mellitus without complication (HCC) 10/01/2020   Left inguinal hernia 10/01/2020   Hydronephrosis 10/01/2020   Thrombocytopenia (HCC) 10/01/2020   HIV (human immunodeficiency virus infection) (HCC) 10/01/2020    -We will continue doing daily dressing changes, patient is aware he may shower between dressing changes.  Strongly advised follow-up with primary care regarding his diabetes control.  We will see him back to follow-up with his wound in about a month.  Will not entertain  elective hernia repair until his diabetes is under better control and his infections have been cleared and healed wounds.  I believe he understands all this and are interpreted was available the entire time.   10/03/2020 M.D., FACS 10/16/2020, 10:57 AM

## 2020-10-16 NOTE — Patient Instructions (Signed)
Follow up here in one month.  seguimiento aqu en un mes Change your packing every day. cambiar el embalaje una vez al da puede humedecer ligeramente el empaque con solucin salina  termine todos sus antibiticos

## 2020-10-24 MED ORDER — SYMTUZA 800 MG-150 MG-200 MG-10 MG TABLET
ORAL_TABLET | Freq: Every day | ORAL | 11 refills | 0 days | Status: CP
Start: 2020-10-24 — End: ?

## 2020-10-24 NOTE — Unmapped (Signed)
Addended byMicheline Maze, Amiyrah Lamere on: 10/24/2020 12:54 AM     Modules accepted: Orders

## 2020-10-28 DIAGNOSIS — E871 Hypo-osmolality and hyponatremia: Secondary | ICD-10-CM | POA: Insufficient documentation

## 2020-10-28 NOTE — Unmapped (Signed)
Thank you for your e-Consult.    To summarize: Francisco Ortiz is a 49 y.o. gentleman with a history of AIDS, DM with hyperglycemia and recent infectious complications on whom we are asked to consult for mild, asymptomatic hyponatremia. Evaluation to this point has included multiple chemistries (both serum and urine), TSH and lipid panel. These reflect normal osmolality, again as noticed by the referring provider. Given this, we have to think about causes of pseudohyponatremia. His blood glucose is elevated, and so we have to correct sodium for this, though his sodium still does not correct into the normal range. The lipid panel does not reveal severe hyperlipidemia. However, recent chemistries do reveal an elevated gamma gap. While this is not specific (and can be seen with HIV infection), in the setting of pseudohyponatremia, it is concerning for a serum paraprotein leading to the laboratory abnormalities.     My recommendations are as follows:   1. Send SPEP and immunofixation  2. Send serum free light chains  3. Should these be unrevealing or negative, would recommend a nephrology clinic visit for further evaluation.    I spent 5-10 minutes in medical consultative discussion and review of medical records, including a written report to the treating provider via electronic health record regarding the condition of this patient.    This e-Consult did include an answerable clinical question and did not recommend a clinic visit.    Rexene Agent, MD    The recommendations provided in this eConsult are based on the clinical data available to me and are furnished without the benefit of a comprehensive in-person evaluation of the patient. Any new clinical issues or changes in patient status not available to me will need to be taken into account when assessing these recommendations. The ongoing management of this patient is the responsibility of the referring clinician. Please contact me if you have further questions.

## 2020-10-30 DIAGNOSIS — L02214 Cutaneous abscess of groin: Principal | ICD-10-CM

## 2020-10-30 MED ORDER — DOXYCYCLINE HYCLATE 100 MG TABLET
ORAL_TABLET | Freq: Two times a day (BID) | ORAL | 0 refills | 10 days
Start: 2020-10-30 — End: ?

## 2020-10-30 NOTE — Unmapped (Signed)
[facility] [place] TELEPHONE ENCOUNTER    Called Mr. Bojarski to check on status of starting new Symtuza. Patient reported he had just recently received a shipment of his previous regimen from [facility] and he plans to finish this regimen first, then switch to [place] next month. Relayed to patient that while he remains on his previous regimen, his atovaquone should be taken twice daily. He will then take atovaquone once daily when on Symtuza. Patient confirms through teach-back.     Mr. Cott also reports he has been experiencing watery, itchy eyes and a rash on his face (described as hives) for the past 4 days. It is improving now, and he cannot identify any triggers. Denies affect on breathing. Patient encouraged to try Benadryl and to contact clinic or go to urgent care if the rash does not improve. Told to call 911 if his breathing is affected.    Derrel Nip, PharmD  PGY2 Ambulatory Care Pharmacy Resident     I discussed the findings, assessment and plan with the pharmacy resident and agree with the findings and plan as documented in the note.    Danton Clap, PharmD, BCIDP, CPP  Clinical Pharmacist Practitioner - HIV/Infectious Diseases

## 2020-10-30 NOTE — Unmapped (Signed)
Therapy completed

## 2020-10-31 MED ORDER — TRIAMCINOLONE ACETONIDE 0.1 % TOPICAL CREAM
Freq: Two times a day (BID) | TOPICAL | 0 refills | 0.00000 days
Start: 2020-10-31 — End: 2021-10-31

## 2020-11-04 NOTE — Unmapped (Signed)
Specialty Medication(s):  ??  ritonavir 100mg   ?? Prezista 600mg   ?? Descovy 200-25mg   ?? Atovaquone 750mg /84ml      Mr.Kriz has been dis-enrolled from the [facility] specialty pharmacy services due to now having  HMAP/UMAP and must use [facility] or H[facility] approved [facility]..    Additional information provided to the patient: n/a    Roderic Palau  [facility] Pharmacist

## 2020-11-11 ENCOUNTER — Ambulatory Visit: Admit: 2020-11-11 | Discharge: 2020-11-12

## 2020-11-11 DIAGNOSIS — B20 Human immunodeficiency virus [HIV] disease: Principal | ICD-10-CM

## 2020-11-11 DIAGNOSIS — E119 Type 2 diabetes mellitus without complications: Principal | ICD-10-CM

## 2020-11-11 DIAGNOSIS — R21 Rash and other nonspecific skin eruption: Principal | ICD-10-CM

## 2020-11-11 DIAGNOSIS — E871 Hypo-osmolality and hyponatremia: Principal | ICD-10-CM

## 2020-11-11 MED ORDER — SULFAMETHOXAZOLE 800 MG-TRIMETHOPRIM 160 MG TABLET
ORAL_TABLET | Freq: Every day | ORAL | 5 refills | 30.00000 days | Status: CP
Start: 2020-11-11 — End: ?

## 2020-11-12 MED ORDER — HYDROXYZINE HCL 25 MG TABLET
ORAL_TABLET | Freq: Three times a day (TID) | ORAL | 0 refills | 7 days | Status: CP
Start: 2020-11-12 — End: ?

## 2020-11-18 ENCOUNTER — Encounter: Payer: Self-pay | Admitting: Surgery

## 2020-11-18 ENCOUNTER — Other Ambulatory Visit: Payer: Self-pay

## 2020-11-18 ENCOUNTER — Ambulatory Visit (INDEPENDENT_AMBULATORY_CARE_PROVIDER_SITE_OTHER): Payer: Self-pay | Admitting: Surgery

## 2020-11-18 VITALS — BP 133/88 | HR 83 | Temp 98.3°F | Ht 65.0 in | Wt 162.2 lb

## 2020-11-18 DIAGNOSIS — K4090 Unilateral inguinal hernia, without obstruction or gangrene, not specified as recurrent: Secondary | ICD-10-CM

## 2020-11-18 NOTE — Progress Notes (Signed)
[facility] POST-OP OFFICE VISIT  11/18/2020  HPI: [name] is a 50 y.o. male 6 weeks s/p incision and drainage of right groin abscess.  He has done quite well.  Reports complete healing without residual.  Denies fevers and chills.  Unfortunately his recent set of blood sugars are in the 350 range, identifying poor glycemic control.  He is scheduled to see a primary care provider in September.  He presents today hoping to proceed with scheduling his hernia repair.  He presents with an interpreter.  Vital signs: BP 133/88   Pulse 83   Temp 98.3 F (36.8 C) (Oral)   Ht 5\' 5"  (1.651 m)   Wt 162 lb 3.2 oz (73.6 kg)   SpO2 98%   BMI 26.99 kg/m    Physical Exam: Constitutional: He appears well. Abdomen: Benign, nontender. Skin: No residual wound.  Assessment/Plan: This is a 50 y.o. male 6 weeks s/p I&D right groin abscess.  Still with uncontrolled diabetes.  Has large left inguinal hernia.  Patient Active Problem List   Diagnosis Date Noted   Hyponatremia 10/28/2020   Cellulitis and abscess of leg, except foot 10/16/2020   Inguinal abscess 10/01/2020   Sepsis (HCC) 10/01/2020   Diabetes mellitus without complication (HCC) 10/01/2020   Left inguinal hernia 10/01/2020   Hydronephrosis 10/01/2020   Thrombocytopenia (HCC) 10/01/2020   HIV (human immunodeficiency virus infection) (HCC) 10/01/2020    -With the interpreter present, we discussed at length the reasoning behind why we will not proceed on with his inguinal hernia surgery.  I expressed a desire to get this hernia repaired for him as soon as we can.  I also expressed that we do not wish to take any unnecessary risks as we would when his sugars are out of control.  I believe he understands this.  I anticipate it may take a month to show compliance and establish good glycemic control.  I will anticipate seeing him in October unless for some reason were able to progress more quickly than I expected.   November M.D., FACS 11/18/2020, 10:29 AM

## 2020-11-26 ENCOUNTER — Ambulatory Visit: Admit: 2020-11-26 | Discharge: 2020-11-27

## 2020-11-26 DIAGNOSIS — B20 Human immunodeficiency virus [HIV] disease: Principal | ICD-10-CM

## 2020-11-26 DIAGNOSIS — I10 Essential (primary) hypertension: Principal | ICD-10-CM

## 2020-11-26 DIAGNOSIS — Z1211 Encounter for screening for malignant neoplasm of colon: Principal | ICD-10-CM

## 2020-11-26 DIAGNOSIS — E119 Type 2 diabetes mellitus without complications: Principal | ICD-10-CM

## 2020-11-26 DIAGNOSIS — R21 Rash and other nonspecific skin eruption: Principal | ICD-10-CM

## 2020-11-26 DIAGNOSIS — N200 Calculus of kidney: Principal | ICD-10-CM

## 2020-11-26 MED ORDER — LISINOPRIL 5 MG TABLET
ORAL_TABLET | Freq: Every day | ORAL | 11 refills | 30 days | Status: CP
Start: 2020-11-26 — End: 2020-11-26

## 2020-11-26 MED ORDER — DULAGLUTIDE 0.75 MG/0.5 ML SUBCUTANEOUS PEN INJECTOR
SUBCUTANEOUS | 5 refills | 28.00000 days | Status: CP
Start: 2020-11-26 — End: 2020-11-26

## 2020-11-26 MED ORDER — SULFAMETHOXAZOLE 800 MG-TRIMETHOPRIM 160 MG TABLET
ORAL_TABLET | Freq: Every day | ORAL | 5 refills | 30.00000 days | Status: CP
Start: 2020-11-26 — End: ?

## 2020-11-26 MED ORDER — GLIPIZIDE 5 MG TABLET
ORAL_TABLET | Freq: Two times a day (BID) | ORAL | 11 refills | 30.00000 days | Status: CP
Start: 2020-11-26 — End: 2021-11-26

## 2020-12-01 ENCOUNTER — Institutional Professional Consult (permissible substitution): Admit: 2020-12-01 | Discharge: 2020-12-02 | Attending: Registered" | Primary: Registered"

## 2020-12-08 ENCOUNTER — Telehealth: Payer: Self-pay | Admitting: Surgery

## 2020-12-08 NOTE — Telephone Encounter (Signed)
Letter written and given to patient

## 2020-12-08 NOTE — Telephone Encounter (Signed)
Patient is here in the office and is asking for a letter for him to return to work.  Please advise.

## 2020-12-30 ENCOUNTER — Ambulatory Visit (INDEPENDENT_AMBULATORY_CARE_PROVIDER_SITE_OTHER): Payer: Self-pay | Admitting: Surgery

## 2020-12-30 ENCOUNTER — Encounter: Payer: Self-pay | Admitting: Surgery

## 2020-12-30 ENCOUNTER — Other Ambulatory Visit: Payer: Self-pay

## 2020-12-30 VITALS — BP 128/85 | HR 92 | Temp 98.0°F | Ht 65.0 in | Wt 157.0 lb

## 2020-12-30 DIAGNOSIS — K4090 Unilateral inguinal hernia, without obstruction or gangrene, not specified as recurrent: Secondary | ICD-10-CM

## 2020-12-30 NOTE — Patient Instructions (Addendum)
We will have you follow up here once you have your blood sugars more under control.  podemos hacer la ciruga una vez que su nivel de azcar en la sangre est bajo control  We will send a clearance form to your provider at [facility] about this.   enviaremos para autorizacin a su mdico que lo atiende por diabetes

## 2020-12-30 NOTE — Progress Notes (Signed)
Request for medical clearance and optimization for surgery due to elevated HgA1C has been faxed to Dr Marguerita Beards at [facility] internal medicine.

## 2020-12-30 NOTE — Progress Notes (Signed)
This gentleman presents today, he has a large known scrotal hernia which is stable, only bothering him when he walks.  He became known to our service due to an infection in the opposite groin which is likely due to his poorly controlled diabetes. We have been awaiting improved control of his diabetes prior to scheduling an elective procedure. Were currently awaiting progress along this line to allow [place] to proceed with his hernia repair. He denies any recurrent infections, reporting his most recent blood sugar was 210, but however has lost his monitoring device.  Her last hemoglobin A1c was 12 couple months ago.  We would definitely like to see that improved before initiating an elective operation involving an implant.  Will defer his current medical management to his primary care, and await their release/approval for proceeding with elective inguinal hernia repair. This was discussed via the interpreter to ensure his complete understanding.

## 2020-12-31 ENCOUNTER — Ambulatory Visit
Admit: 2020-12-31 | Discharge: 2020-12-31 | Attending: Student in an Organized Health Care Education/Training Program | Primary: Student in an Organized Health Care Education/Training Program

## 2020-12-31 ENCOUNTER — Ambulatory Visit: Admit: 2020-12-31 | Discharge: 2020-12-31

## 2020-12-31 DIAGNOSIS — E119 Type 2 diabetes mellitus without complications: Principal | ICD-10-CM

## 2020-12-31 DIAGNOSIS — R0781 Pleurodynia: Principal | ICD-10-CM

## 2020-12-31 DIAGNOSIS — R1011 Right upper quadrant pain: Principal | ICD-10-CM

## 2020-12-31 DIAGNOSIS — B20 Human immunodeficiency virus [HIV] disease: Principal | ICD-10-CM

## 2020-12-31 MED ORDER — INSULIN HUMAN U-100 NPH-REGULR 70-30 MIX 100 UNIT/ML SUBCUTANEOUS SUSP
Freq: Every day | SUBCUTANEOUS | 2 refills | 200.00000 days | Status: CP
Start: 2020-12-31 — End: 2020-12-31

## 2020-12-31 MED ORDER — INSULIN SYRINGE U-100 WITH NEEDLE 0.5 ML 30 GAUGE X 1/2" (12 MM)
Freq: Two times a day (BID) | 3 refills | 0.00000 days | Status: CP
Start: 2020-12-31 — End: ?

## 2021-01-05 ENCOUNTER — Telehealth: Payer: Self-pay

## 2021-01-05 NOTE — Telephone Encounter (Signed)
Patient currently being followed by PCP for uncontrolled HgB A1C-last seen 12/31/20.

## 2021-01-07 MED ORDER — CLOBETASOL 0.05 % TOPICAL OINTMENT
OPHTHALMIC | 5 refills | 0.00000 days | Status: CP
Start: 2021-01-07 — End: ?

## 2021-01-12 MED ORDER — PEG 3350-ELECTROLYTES 236 GRAM-22.74 GRAM-6.74 GRAM-5.86 GRAM SOLUTION
Freq: Once | ORAL | 0 refills | 1 days | Status: CP
Start: 2021-01-12 — End: 2021-01-13
  Filled 2021-01-13: qty 4000, 1d supply, fill #0

## 2021-01-28 ENCOUNTER — Ambulatory Visit: Admit: 2021-01-28

## 2021-02-17 ENCOUNTER — Ambulatory Visit: Admit: 2021-02-17 | Discharge: 2021-02-18

## 2021-02-17 DIAGNOSIS — E119 Type 2 diabetes mellitus without complications: Principal | ICD-10-CM

## 2021-02-17 DIAGNOSIS — B20 Human immunodeficiency virus [HIV] disease: Principal | ICD-10-CM

## 2021-02-17 DIAGNOSIS — L02411 Cutaneous abscess of right axilla: Principal | ICD-10-CM

## 2021-02-17 MED ORDER — CLINDAMYCIN HCL 150 MG CAPSULE
ORAL_CAPSULE | Freq: Three times a day (TID) | ORAL | 0 refills | 5 days | Status: CP
Start: 2021-02-17 — End: 2021-02-22

## 2021-02-17 MED ORDER — METFORMIN 850 MG TABLET
ORAL_TABLET | Freq: Two times a day (BID) | ORAL | 5 refills | 30 days | Status: CP
Start: 2021-02-17 — End: ?

## 2021-02-21 ENCOUNTER — Ambulatory Visit: Admit: 2021-02-21 | Discharge: 2021-02-22 | Attending: Family Medicine | Primary: Family Medicine

## 2021-02-21 DIAGNOSIS — B20 Human immunodeficiency virus [HIV] disease: Principal | ICD-10-CM

## 2021-02-21 DIAGNOSIS — Z794 Long term (current) use of insulin: Principal | ICD-10-CM

## 2021-02-21 DIAGNOSIS — E119 Type 2 diabetes mellitus without complications: Principal | ICD-10-CM

## 2021-02-21 DIAGNOSIS — L02412 Cutaneous abscess of left axilla: Principal | ICD-10-CM

## 2021-02-28 ENCOUNTER — Ambulatory Visit: Admit: 2021-02-28 | Discharge: 2021-03-01

## 2021-02-28 DIAGNOSIS — L02411 Cutaneous abscess of right axilla: Principal | ICD-10-CM

## 2021-03-07 ENCOUNTER — Ambulatory Visit: Admit: 2021-03-07 | Discharge: 2021-03-08 | Attending: Family Medicine | Primary: Family Medicine

## 2021-03-07 DIAGNOSIS — L02412 Cutaneous abscess of left axilla: Principal | ICD-10-CM

## 2021-03-07 DIAGNOSIS — L02411 Cutaneous abscess of right axilla: Principal | ICD-10-CM

## 2021-03-07 MED ORDER — CLINDAMYCIN HCL 300 MG CAPSULE
ORAL_CAPSULE | Freq: Four times a day (QID) | ORAL | 0 refills | 8 days | Status: CP
Start: 2021-03-07 — End: ?

## 2021-03-10 ENCOUNTER — Ambulatory Visit: Payer: Self-pay | Admitting: Surgery

## 2021-04-06 DIAGNOSIS — L02411 Cutaneous abscess of right axilla: Principal | ICD-10-CM

## 2021-04-06 MED ORDER — CLINDAMYCIN HCL 150 MG CAPSULE
ORAL_CAPSULE | 0 refills | 0.00000 days
Start: 2021-04-06 — End: ?

## 2021-04-07 MED ORDER — CLINDAMYCIN HCL 150 MG CAPSULE
ORAL_CAPSULE | 0 refills | 0.00000 days
Start: 2021-04-07 — End: ?

## 2021-04-15 ENCOUNTER — Ambulatory Visit: Admit: 2021-04-15 | Discharge: 2021-04-16

## 2021-04-15 DIAGNOSIS — N200 Calculus of kidney: Principal | ICD-10-CM

## 2021-04-15 DIAGNOSIS — Z87448 Personal history of other diseases of urinary system: Principal | ICD-10-CM

## 2021-04-15 DIAGNOSIS — E119 Type 2 diabetes mellitus without complications: Principal | ICD-10-CM

## 2021-04-15 DIAGNOSIS — I10 Essential (primary) hypertension: Principal | ICD-10-CM

## 2021-04-15 DIAGNOSIS — Z794 Long term (current) use of insulin: Principal | ICD-10-CM

## 2021-04-15 DIAGNOSIS — B20 Human immunodeficiency virus [HIV] disease: Principal | ICD-10-CM

## 2021-04-15 DIAGNOSIS — Z1211 Encounter for screening for malignant neoplasm of colon: Principal | ICD-10-CM

## 2021-04-15 DIAGNOSIS — K4090 Unilateral inguinal hernia, without obstruction or gangrene, not specified as recurrent: Principal | ICD-10-CM

## 2021-04-15 MED ORDER — ATORVASTATIN 10 MG TABLET
ORAL_TABLET | Freq: Every day | ORAL | 3 refills | 90 days | Status: CP
Start: 2021-04-15 — End: 2022-04-10

## 2021-05-05 DIAGNOSIS — E119 Type 2 diabetes mellitus without complications: Principal | ICD-10-CM

## 2021-05-05 MED ORDER — GLIPIZIDE 5 MG TABLET
ORAL_TABLET | Freq: Two times a day (BID) | ORAL | 11 refills | 30 days | Status: CP
Start: 2021-05-05 — End: 2022-04-15

## 2021-05-07 DIAGNOSIS — E119 Type 2 diabetes mellitus without complications: Principal | ICD-10-CM

## 2021-05-07 MED ORDER — GLIPIZIDE 5 MG TABLET
ORAL_TABLET | Freq: Two times a day (BID) | ORAL | 11 refills | 30.00000 days | Status: CP
Start: 2021-05-07 — End: 2022-04-17

## 2021-05-19 ENCOUNTER — Ambulatory Visit: Admit: 2021-05-19 | Discharge: 2021-05-20

## 2021-05-19 DIAGNOSIS — B20 Human immunodeficiency virus [HIV] disease: Principal | ICD-10-CM

## 2021-05-19 DIAGNOSIS — H538 Other visual disturbances: Principal | ICD-10-CM

## 2021-05-20 ENCOUNTER — Ambulatory Visit: Admit: 2021-05-20 | Discharge: 2021-05-20

## 2021-05-20 DIAGNOSIS — Z87448 Personal history of other diseases of urinary system: Principal | ICD-10-CM

## 2021-06-03 ENCOUNTER — Other Ambulatory Visit: Payer: Self-pay

## 2021-06-09 DIAGNOSIS — B20 Human immunodeficiency virus [HIV] disease: Principal | ICD-10-CM

## 2021-06-09 MED ORDER — SULFAMETHOXAZOLE 800 MG-TRIMETHOPRIM 160 MG TABLET
ORAL_TABLET | Freq: Every day | ORAL | 5 refills | 30 days | Status: CP
Start: 2021-06-09 — End: ?

## 2021-07-01 ENCOUNTER — Ambulatory Visit
Admit: 2021-07-01 | Discharge: 2021-07-02 | Attending: Student in an Organized Health Care Education/Training Program | Primary: Student in an Organized Health Care Education/Training Program

## 2021-07-01 DIAGNOSIS — H25049 Posterior subcapsular polar age-related cataract, unspecified eye: Principal | ICD-10-CM

## 2021-07-01 DIAGNOSIS — H25819 Combined forms of age-related cataract, unspecified eye: Principal | ICD-10-CM

## 2021-07-01 DIAGNOSIS — H538 Other visual disturbances: Principal | ICD-10-CM

## 2021-07-07 ENCOUNTER — Ambulatory Visit: Admit: 2021-07-07 | Discharge: 2021-07-08

## 2021-07-07 DIAGNOSIS — K4090 Unilateral inguinal hernia, without obstruction or gangrene, not specified as recurrent: Principal | ICD-10-CM

## 2021-07-15 ENCOUNTER — Ambulatory Visit: Admit: 2021-07-15

## 2021-08-19 ENCOUNTER — Encounter: Admit: 2021-08-19 | Discharge: 2021-08-19 | Attending: Anesthesiology | Primary: Anesthesiology

## 2021-08-19 ENCOUNTER — Ambulatory Visit: Admit: 2021-08-19 | Discharge: 2021-08-19

## 2021-08-19 MED ORDER — OFLOXACIN 0.3 % EYE DROPS
0 refills | 0 days | Status: CP
Start: 2021-08-19 — End: ?
  Filled 2021-08-19: qty 5, 25d supply, fill #0

## 2021-08-19 MED ORDER — PREDNISOLONE ACETATE 1 % EYE DROPS,SUSPENSION
0 refills | 0 days | Status: CP
Start: 2021-08-19 — End: ?
  Filled 2021-08-19: qty 5, 25d supply, fill #0

## 2021-08-20 ENCOUNTER — Ambulatory Visit: Admit: 2021-08-20 | Discharge: 2021-08-21 | Attending: Ophthalmology | Primary: Ophthalmology

## 2021-08-20 DIAGNOSIS — Z961 Presence of intraocular lens: Principal | ICD-10-CM

## 2021-08-20 DIAGNOSIS — Z9842 Cataract extraction status, left eye: Principal | ICD-10-CM

## 2021-08-28 ENCOUNTER — Ambulatory Visit: Admit: 2021-08-28 | Discharge: 2021-08-29 | Attending: Ophthalmology | Primary: Ophthalmology

## 2021-09-09 ENCOUNTER — Encounter: Admit: 2021-09-09 | Discharge: 2021-09-09

## 2021-09-09 ENCOUNTER — Ambulatory Visit: Admit: 2021-09-09 | Discharge: 2021-09-09

## 2021-09-09 MED ORDER — PREDNISOLONE ACETATE 1 % EYE DROPS,SUSPENSION
Freq: Four times a day (QID) | TOPICAL | 0 refills | 25 days | Status: CP
Start: 2021-09-09 — End: ?
  Filled 2021-09-09: qty 5, 25d supply, fill #0

## 2021-09-09 MED ORDER — MOXIFLOXACIN 0.5 % EYE DROPS
0 refills | 0 days | Status: CP
Start: 2021-09-09 — End: ?
  Filled 2021-09-09: qty 3, 15d supply, fill #0

## 2021-09-10 ENCOUNTER — Ambulatory Visit: Admit: 2021-09-10 | Discharge: 2021-09-11

## 2021-09-10 DIAGNOSIS — Z9841 Cataract extraction status, right eye: Principal | ICD-10-CM

## 2021-09-10 DIAGNOSIS — Z961 Presence of intraocular lens: Principal | ICD-10-CM

## 2021-09-18 ENCOUNTER — Ambulatory Visit: Admit: 2021-09-18 | Discharge: 2021-09-19

## 2021-09-18 DIAGNOSIS — Z9842 Cataract extraction status, left eye: Principal | ICD-10-CM

## 2021-09-18 DIAGNOSIS — Z961 Presence of intraocular lens: Principal | ICD-10-CM

## 2021-09-18 DIAGNOSIS — Z9841 Cataract extraction status, right eye: Principal | ICD-10-CM

## 2021-10-09 ENCOUNTER — Ambulatory Visit
Admit: 2021-10-09 | Discharge: 2021-10-10 | Attending: Student in an Organized Health Care Education/Training Program | Primary: Student in an Organized Health Care Education/Training Program

## 2021-10-09 DIAGNOSIS — Z961 Presence of intraocular lens: Principal | ICD-10-CM

## 2021-10-09 DIAGNOSIS — Z9842 Cataract extraction status, left eye: Principal | ICD-10-CM

## 2021-10-09 DIAGNOSIS — Z9841 Cataract extraction status, right eye: Principal | ICD-10-CM

## 2021-12-31 ENCOUNTER — Ambulatory Visit: Admit: 2021-12-31 | Discharge: 2022-01-14 | Payer: MEDICARE

## 2021-12-31 ENCOUNTER — Ambulatory Visit: Admit: 2021-12-31 | Payer: MEDICARE

## 2021-12-31 ENCOUNTER — Ambulatory Visit: Admit: 2021-12-31 | Discharge: 2022-01-14 | Disposition: A | Discharge status: Home / Self Care | Payer: MEDICARE

## 2022-01-13 MED ORDER — BLOOD GLUCOSE TEST STRIPS
ORAL_STRIP | 0 refills | 0.00000 days | Status: CP
Start: 2022-01-13 — End: 2023-01-13
  Filled 2022-01-14: qty 1, 30d supply, fill #0

## 2022-01-13 MED ORDER — ATORVASTATIN 10 MG TABLET
ORAL_TABLET | Freq: Every day | ORAL | 0 refills | 30 days | Status: CP
Start: 2022-01-13 — End: 2022-02-12
  Filled 2022-01-14: qty 30, 30d supply, fill #0

## 2022-01-13 MED ORDER — GABAPENTIN 300 MG CAPSULE
ORAL_CAPSULE | Freq: Every evening | ORAL | 0 refills | 30 days | Status: CN
Start: 2022-01-13 — End: 2022-02-12

## 2022-01-13 MED ORDER — BLOOD-GLUCOSE METER KIT WRAPPER
0 refills | 0 days | Status: CP
Start: 2022-01-13 — End: 2023-01-13

## 2022-01-13 MED ORDER — INSULIN NPH ISOPHANE U-100 HUMAN 100 UNIT/ML SUBCUTANEOUS SUSPENSION
Freq: Two times a day (BID) | SUBCUTANEOUS | 0 refills | 30.00000 days | Status: CP
Start: 2022-01-13 — End: 2022-01-13
  Filled 2022-01-14: qty 15, 75d supply, fill #0

## 2022-01-13 MED ORDER — PREZCOBIX 800 MG-150 MG TABLET
ORAL_TABLET | Freq: Every day | ORAL | 0 refills | 30 days | Status: CP
Start: 2022-01-13 — End: 2022-02-12
  Filled 2022-01-14: qty 30, 30d supply, fill #0

## 2022-01-13 MED ORDER — INSULIN LISPRO (U-100) 100 UNIT/ML SUBCUTANEOUS SOLUTION
Freq: Four times a day (QID) | SUBCUTANEOUS | 0 refills | 38 days | Status: CN
Start: 2022-01-13 — End: 2022-02-12

## 2022-01-13 MED ORDER — METFORMIN 850 MG TABLET
ORAL_TABLET | Freq: Two times a day (BID) | ORAL | 0 refills | 30 days | Status: CP
Start: 2022-01-13 — End: 2022-02-12
  Filled 2022-01-14: qty 60, 30d supply, fill #0

## 2022-01-13 MED ORDER — SYMTUZA 800 MG-150 MG-200 MG-10 MG TABLET
ORAL_TABLET | Freq: Every day | ORAL | 11 refills | 0.00000 days | Status: CP
Start: 2022-01-13 — End: 2022-01-13

## 2022-01-13 MED ORDER — INSULIN NPH ISOPHANE U-100 HUMAN 100 UNIT/ML (3 ML) SUBCUTANEOUS PEN
Freq: Two times a day (BID) | SUBCUTANEOUS | 0 refills | 75 days | Status: CP
Start: 2022-01-13 — End: 2022-03-29

## 2022-01-13 MED ORDER — GABAPENTIN 100 MG CAPSULE
ORAL_CAPSULE | 0 refills | 0 days | Status: CP
Start: 2022-01-13 — End: ?
  Filled 2022-01-14: qty 150, 30d supply, fill #0

## 2022-01-13 MED ORDER — PEN NEEDLE, DIABETIC 32 GAUGE X 5/32" (4 MM)
Freq: Once | 0 refills | 100 days | Status: CP
Start: 2022-01-13 — End: 2022-04-23

## 2022-01-13 MED ORDER — LEVETIRACETAM 500 MG TABLET
ORAL_TABLET | Freq: Two times a day (BID) | ORAL | 0 refills | 30 days | Status: CP
Start: 2022-01-13 — End: 2022-02-12
  Filled 2022-01-14: qty 120, 30d supply, fill #0

## 2022-01-13 MED ORDER — SULFAMETHOXAZOLE 800 MG-TRIMETHOPRIM 160 MG TABLET
ORAL_TABLET | Freq: Two times a day (BID) | ORAL | 0 refills | 39 days | Status: CP
Start: 2022-01-13 — End: 2022-02-21
  Filled 2022-01-14: qty 234, 39d supply, fill #0

## 2022-01-13 MED ORDER — LISINOPRIL 5 MG TABLET
ORAL_TABLET | Freq: Every day | ORAL | 0 refills | 30 days | Status: CP
Start: 2022-01-13 — End: 2022-02-12
  Filled 2022-01-14: qty 30, 30d supply, fill #0

## 2022-01-13 MED ORDER — EMTRICITABINE 200 MG-TENOFOVIR ALAFENAMIDE FUMARATE 25 MG TABLET
ORAL_TABLET | Freq: Every day | ORAL | 0 refills | 30 days | Status: CP
Start: 2022-01-13 — End: 2022-02-12

## 2022-01-13 MED ORDER — LANCETS 30 GAUGE
5 refills | 0 days | Status: CP
Start: 2022-01-13 — End: ?
  Filled 2022-01-14: qty 100, 20d supply, fill #0

## 2022-01-14 MED ORDER — GLIPIZIDE 5 MG TABLET
ORAL_TABLET | Freq: Two times a day (BID) | ORAL | 0 refills | 30 days | Status: CP
Start: 2022-01-14 — End: 2022-02-13
  Filled 2022-01-14: qty 60, 30d supply, fill #0

## 2022-01-14 MED ORDER — PANTOPRAZOLE 40 MG TABLET,DELAYED RELEASE
ORAL_TABLET | Freq: Every day | ORAL | 0 refills | 30 days | Status: CP
Start: 2022-01-14 — End: 2022-02-13
  Filled 2022-01-14: qty 30, 30d supply, fill #0

## 2022-01-14 MED FILL — ON CALL EXPRESS TEST STRIP: 20 days supply | Qty: 100 | Fill #0

## 2022-01-14 MED FILL — UNIFINE PENTIPS 32 GAUGE X 5/32" NEEDLE (4 MM): 100 days supply | Qty: 100 | Fill #0

## 2022-01-14 MED FILL — DESCOVY 200 MG-25 MG TABLET: ORAL | 30 days supply | Qty: 30 | Fill #0

## 2022-01-15 DIAGNOSIS — Z09 Encounter for follow-up examination after completed treatment for conditions other than malignant neoplasm: Principal | ICD-10-CM

## 2022-01-22 ENCOUNTER — Ambulatory Visit: Admit: 2022-01-22 | Discharge: 2022-01-23

## 2022-01-22 DIAGNOSIS — E119 Type 2 diabetes mellitus without complications: Principal | ICD-10-CM

## 2022-01-22 DIAGNOSIS — B20 Human immunodeficiency virus [HIV] disease: Principal | ICD-10-CM

## 2022-01-22 DIAGNOSIS — Z09 Encounter for follow-up examination after completed treatment for conditions other than malignant neoplasm: Principal | ICD-10-CM

## 2022-01-22 DIAGNOSIS — N179 Acute kidney failure, unspecified: Principal | ICD-10-CM

## 2022-01-22 DIAGNOSIS — I10 Essential (primary) hypertension: Principal | ICD-10-CM

## 2022-01-22 MED ORDER — INSULIN NPH ISOPHANE U-100 HUMAN 100 UNIT/ML (3 ML) SUBCUTANEOUS PEN
Freq: Two times a day (BID) | SUBCUTANEOUS | 0 refills | 75 days | Status: CP
Start: 2022-01-22 — End: 2022-04-07

## 2022-01-26 ENCOUNTER — Ambulatory Visit: Admit: 2022-01-26 | Discharge: 2022-01-27

## 2022-01-26 DIAGNOSIS — N179 Acute kidney failure, unspecified: Principal | ICD-10-CM

## 2022-01-26 DIAGNOSIS — I10 Essential (primary) hypertension: Principal | ICD-10-CM

## 2022-01-26 DIAGNOSIS — G939 Disorder of brain, unspecified: Principal | ICD-10-CM

## 2022-01-26 DIAGNOSIS — B20 Human immunodeficiency virus [HIV] disease: Principal | ICD-10-CM

## 2022-01-26 DIAGNOSIS — Z794 Long term (current) use of insulin: Principal | ICD-10-CM

## 2022-01-26 DIAGNOSIS — E119 Type 2 diabetes mellitus without complications: Principal | ICD-10-CM

## 2022-01-26 DIAGNOSIS — B582 Toxoplasma meningoencephalitis: Principal | ICD-10-CM

## 2022-01-26 DIAGNOSIS — R569 Unspecified convulsions: Principal | ICD-10-CM

## 2022-01-26 MED ORDER — SYMTUZA 800 MG-150 MG-200 MG-10 MG TABLET
ORAL_TABLET | Freq: Every day | ORAL | 11 refills | 0 days | Status: CP
Start: 2022-01-26 — End: ?

## 2022-01-26 MED ORDER — PANTOPRAZOLE 40 MG TABLET,DELAYED RELEASE
ORAL_TABLET | Freq: Every day | ORAL | 0 refills | 30 days | Status: CP
Start: 2022-01-26 — End: 2022-02-25

## 2022-01-26 MED ORDER — METFORMIN 850 MG TABLET
ORAL_TABLET | Freq: Two times a day (BID) | ORAL | 0 refills | 30 days | Status: CP
Start: 2022-01-26 — End: 2022-02-25

## 2022-01-26 MED ORDER — EMTRICITABINE 200 MG-TENOFOVIR ALAFENAMIDE FUMARATE 25 MG TABLET
ORAL_TABLET | Freq: Every day | ORAL | 0 refills | 30 days | Status: CN
Start: 2022-01-26 — End: 2022-02-25

## 2022-01-26 MED ORDER — GLIPIZIDE 5 MG TABLET
ORAL_TABLET | Freq: Two times a day (BID) | ORAL | 0 refills | 30 days | Status: CP
Start: 2022-01-26 — End: 2022-02-25

## 2022-01-26 MED ORDER — PEN NEEDLE, DIABETIC 32 GAUGE X 5/32" (4 MM)
Freq: Once | 0 refills | 100 days | Status: CP
Start: 2022-01-26 — End: 2022-01-26

## 2022-01-26 MED ORDER — ATORVASTATIN 10 MG TABLET
ORAL_TABLET | Freq: Every day | ORAL | 0 refills | 30 days | Status: CP
Start: 2022-01-26 — End: 2022-02-25

## 2022-01-26 MED ORDER — INSULIN NPH ISOPHANE U-100 HUMAN 100 UNIT/ML (3 ML) SUBCUTANEOUS PEN
Freq: Two times a day (BID) | SUBCUTANEOUS | 0 refills | 75 days | Status: CP
Start: 2022-01-26 — End: 2022-04-11

## 2022-01-26 MED ORDER — LEVETIRACETAM 500 MG TABLET
ORAL_TABLET | Freq: Two times a day (BID) | ORAL | 0 refills | 30 days | Status: CP
Start: 2022-01-26 — End: 2022-02-25

## 2022-01-26 MED ORDER — SULFAMETHOXAZOLE 800 MG-TRIMETHOPRIM 160 MG TABLET
ORAL_TABLET | Freq: Two times a day (BID) | ORAL | 0 refills | 39 days | Status: CP
Start: 2022-01-26 — End: 2022-03-06

## 2022-01-26 MED ORDER — LISINOPRIL 5 MG TABLET
ORAL_TABLET | Freq: Every day | ORAL | 0 refills | 30 days | Status: CP
Start: 2022-01-26 — End: 2022-02-25

## 2022-01-26 MED ORDER — PREZCOBIX 800 MG-150 MG TABLET
ORAL_TABLET | Freq: Every day | ORAL | 0 refills | 30 days | Status: CN
Start: 2022-01-26 — End: 2022-02-25

## 2022-01-26 MED ORDER — GABAPENTIN 100 MG CAPSULE
ORAL_CAPSULE | 0 refills | 0 days | Status: CP
Start: 2022-01-26 — End: ?

## 2022-02-23 ENCOUNTER — Ambulatory Visit: Admit: 2022-02-23 | Discharge: 2022-02-24

## 2022-02-23 DIAGNOSIS — B20 Human immunodeficiency virus [HIV] disease: Principal | ICD-10-CM

## 2022-02-23 DIAGNOSIS — B582 Toxoplasma meningoencephalitis: Principal | ICD-10-CM

## 2022-02-23 DIAGNOSIS — I10 Essential (primary) hypertension: Principal | ICD-10-CM

## 2022-02-23 DIAGNOSIS — E119 Type 2 diabetes mellitus without complications: Principal | ICD-10-CM

## 2022-02-23 MED ORDER — LISINOPRIL 5 MG TABLET
ORAL_TABLET | Freq: Every day | ORAL | 0 refills | 30 days | Status: CP
Start: 2022-02-23 — End: 2022-03-25

## 2022-02-23 MED ORDER — METFORMIN 850 MG TABLET
ORAL_TABLET | Freq: Two times a day (BID) | ORAL | 0 refills | 30 days | Status: CP
Start: 2022-02-23 — End: 2022-03-25

## 2022-02-23 MED ORDER — SULFAMETHOXAZOLE 800 MG-TRIMETHOPRIM 160 MG TABLET
ORAL_TABLET | Freq: Two times a day (BID) | ORAL | 0 refills | 30 days | Status: CP
Start: 2022-02-23 — End: 2022-03-25

## 2022-02-23 MED ORDER — GLIPIZIDE 5 MG TABLET
ORAL_TABLET | Freq: Two times a day (BID) | ORAL | 0 refills | 30 days | Status: CP
Start: 2022-02-23 — End: 2022-03-25

## 2022-03-09 ENCOUNTER — Ambulatory Visit: Admit: 2022-03-09 | Discharge: 2022-03-09

## 2022-03-09 DIAGNOSIS — E119 Type 2 diabetes mellitus without complications: Principal | ICD-10-CM

## 2022-03-09 DIAGNOSIS — B20 Human immunodeficiency virus [HIV] disease: Principal | ICD-10-CM

## 2022-03-09 DIAGNOSIS — I10 Essential (primary) hypertension: Principal | ICD-10-CM

## 2022-03-09 MED ORDER — INSULIN DETEMIR (U-100) 100 UNIT/ML (3 ML) SUBCUTANEOUS PEN
Freq: Every day | SUBCUTANEOUS | 3 refills | 90 days | Status: CP
Start: 2022-03-09 — End: 2023-03-09

## 2022-03-10 DIAGNOSIS — B20 Human immunodeficiency virus [HIV] disease: Principal | ICD-10-CM

## 2022-03-26 DIAGNOSIS — I10 Essential (primary) hypertension: Principal | ICD-10-CM

## 2022-03-26 DIAGNOSIS — E119 Type 2 diabetes mellitus without complications: Principal | ICD-10-CM

## 2022-03-26 MED ORDER — GLIPIZIDE 5 MG TABLET
ORAL_TABLET | Freq: Two times a day (BID) | ORAL | 11 refills | 30 days
Start: 2022-03-26 — End: ?

## 2022-03-26 MED ORDER — METFORMIN 850 MG TABLET
ORAL_TABLET | 11 refills | 0 days
Start: 2022-03-26 — End: ?

## 2022-03-26 MED ORDER — LISINOPRIL 5 MG TABLET
ORAL_TABLET | Freq: Every day | ORAL | 11 refills | 30 days
Start: 2022-03-26 — End: 2023-03-21

## 2022-03-27 DIAGNOSIS — I10 Essential (primary) hypertension: Principal | ICD-10-CM

## 2022-03-27 DIAGNOSIS — E119 Type 2 diabetes mellitus without complications: Principal | ICD-10-CM

## 2022-03-27 DIAGNOSIS — B582 Toxoplasma meningoencephalitis: Principal | ICD-10-CM

## 2022-03-27 DIAGNOSIS — Z794 Long term (current) use of insulin: Principal | ICD-10-CM

## 2022-03-27 DIAGNOSIS — B20 Human immunodeficiency virus [HIV] disease: Principal | ICD-10-CM

## 2022-03-27 MED ORDER — SULFAMETHOXAZOLE 800 MG-TRIMETHOPRIM 160 MG TABLET
ORAL_TABLET | Freq: Two times a day (BID) | ORAL | 0 refills | 30 days | Status: CP
Start: 2022-03-27 — End: 2022-04-26

## 2022-03-27 MED ORDER — GLIPIZIDE 5 MG TABLET
ORAL_TABLET | Freq: Two times a day (BID) | ORAL | 11 refills | 30 days | Status: CP
Start: 2022-03-27 — End: 2023-03-22

## 2022-03-27 MED ORDER — METFORMIN 850 MG TABLET
ORAL_TABLET | Freq: Two times a day (BID) | ORAL | 5 refills | 30 days | Status: CP
Start: 2022-03-27 — End: 2022-09-23

## 2022-03-27 MED ORDER — ATORVASTATIN 10 MG TABLET
ORAL_TABLET | Freq: Every day | ORAL | 11 refills | 30 days | Status: CP
Start: 2022-03-27 — End: 2023-03-22

## 2022-03-27 MED ORDER — LEVETIRACETAM 500 MG TABLET
ORAL_TABLET | Freq: Two times a day (BID) | ORAL | 11 refills | 30 days | Status: CP
Start: 2022-03-27 — End: 2023-03-22

## 2022-03-27 MED ORDER — PANTOPRAZOLE 40 MG TABLET,DELAYED RELEASE
ORAL_TABLET | Freq: Every day | ORAL | 11 refills | 30 days | Status: CP
Start: 2022-03-27 — End: 2023-03-22

## 2022-03-27 MED ORDER — LISINOPRIL 5 MG TABLET
ORAL_TABLET | Freq: Every day | ORAL | 11 refills | 30 days | Status: CP
Start: 2022-03-27 — End: 2023-03-22

## 2022-05-04 ENCOUNTER — Ambulatory Visit: Admit: 2022-05-04 | Discharge: 2022-05-05

## 2022-05-04 DIAGNOSIS — B20 Human immunodeficiency virus [HIV] disease: Principal | ICD-10-CM

## 2022-05-04 DIAGNOSIS — E119 Type 2 diabetes mellitus without complications: Principal | ICD-10-CM

## 2022-05-04 DIAGNOSIS — K219 Gastro-esophageal reflux disease without esophagitis: Principal | ICD-10-CM

## 2022-05-04 DIAGNOSIS — I10 Essential (primary) hypertension: Principal | ICD-10-CM

## 2022-05-04 MED ORDER — OMEPRAZOLE 20 MG CAPSULE,DELAYED RELEASE
ORAL_CAPSULE | Freq: Every day | ORAL | 3 refills | 90 days | Status: CP
Start: 2022-05-04 — End: 2023-05-04

## 2022-05-04 MED ORDER — INSULIN GLARGINE (U-100) 100 UNIT/ML (3 ML) SUBCUTANEOUS PEN
Freq: Every evening | SUBCUTANEOUS | 3 refills | 150 days | Status: CP
Start: 2022-05-04 — End: 2023-12-25

## 2022-05-25 ENCOUNTER — Other Ambulatory Visit: Payer: Self-pay

## 2022-06-02 DIAGNOSIS — E119 Type 2 diabetes mellitus without complications: Principal | ICD-10-CM

## 2022-06-02 DIAGNOSIS — B582 Toxoplasma meningoencephalitis: Principal | ICD-10-CM

## 2022-06-02 MED ORDER — SULFAMETHOXAZOLE 800 MG-TRIMETHOPRIM 160 MG TABLET
ORAL_TABLET | 0 refills | 0 days | Status: CP
Start: 2022-06-02 — End: ?

## 2022-06-02 MED ORDER — GABAPENTIN 100 MG CAPSULE
ORAL_CAPSULE | 0 refills | 0 days | Status: CP
Start: 2022-06-02 — End: ?

## 2022-06-02 MED ORDER — INSULIN GLARGINE (U-100) 100 UNIT/ML (3 ML) SUBCUTANEOUS PEN
Freq: Every evening | SUBCUTANEOUS | 3 refills | 150 days
Start: 2022-06-02 — End: 2024-01-23

## 2022-06-03 MED ORDER — INSULIN GLARGINE (U-100) 100 UNIT/ML (3 ML) SUBCUTANEOUS PEN
Freq: Every evening | SUBCUTANEOUS | 3 refills | 150 days | Status: CP
Start: 2022-06-03 — End: 2024-01-24

## 2022-06-09 ENCOUNTER — Ambulatory Visit: Admit: 2022-06-09 | Discharge: 2022-06-10

## 2022-06-09 DIAGNOSIS — B20 Human immunodeficiency virus [HIV] disease: Principal | ICD-10-CM

## 2022-06-09 DIAGNOSIS — K4090 Unilateral inguinal hernia, without obstruction or gangrene, not specified as recurrent: Principal | ICD-10-CM

## 2022-06-09 DIAGNOSIS — B582 Toxoplasma meningoencephalitis: Principal | ICD-10-CM

## 2022-06-09 DIAGNOSIS — R110 Nausea: Principal | ICD-10-CM

## 2022-06-09 DIAGNOSIS — E119 Type 2 diabetes mellitus without complications: Principal | ICD-10-CM

## 2022-06-09 DIAGNOSIS — K625 Hemorrhage of anus and rectum: Principal | ICD-10-CM

## 2022-06-09 DIAGNOSIS — I10 Essential (primary) hypertension: Principal | ICD-10-CM

## 2022-06-09 MED ORDER — INSULIN GLARGINE (U-100) 100 UNIT/ML (3 ML) SUBCUTANEOUS PEN
Freq: Every evening | SUBCUTANEOUS | 3 refills | 68 days | Status: CP
Start: 2022-06-09 — End: 2024-01-30

## 2022-06-15 ENCOUNTER — Ambulatory Visit: Admit: 2022-06-15 | Discharge: 2022-06-16

## 2022-06-15 DIAGNOSIS — Z794 Long term (current) use of insulin: Principal | ICD-10-CM

## 2022-06-15 DIAGNOSIS — B582 Toxoplasma meningoencephalitis: Principal | ICD-10-CM

## 2022-06-15 DIAGNOSIS — E119 Type 2 diabetes mellitus without complications: Principal | ICD-10-CM

## 2022-06-15 DIAGNOSIS — B20 Human immunodeficiency virus [HIV] disease: Principal | ICD-10-CM

## 2022-06-15 MED ORDER — GABAPENTIN 100 MG CAPSULE
ORAL_CAPSULE | Freq: Three times a day (TID) | ORAL | 0 refills | 50 days | Status: CP
Start: 2022-06-15 — End: ?

## 2022-06-15 MED ORDER — DORZOLAMIDE 22.3 MG-TIMOLOL 6.8 MG/ML EYE DROPS
Freq: Two times a day (BID) | OPHTHALMIC | 2 refills | 100 days | Status: CP
Start: 2022-06-15 — End: 2023-06-15

## 2022-06-28 ENCOUNTER — Institutional Professional Consult (permissible substitution): Admit: 2022-06-28 | Discharge: 2022-06-29

## 2022-07-07 DIAGNOSIS — B582 Toxoplasma meningoencephalitis: Principal | ICD-10-CM

## 2022-07-07 MED ORDER — SULFAMETHOXAZOLE 800 MG-TRIMETHOPRIM 160 MG TABLET
ORAL_TABLET | 0 refills | 0 days
Start: 2022-07-07 — End: ?

## 2022-07-08 MED ORDER — SULFAMETHOXAZOLE 800 MG-TRIMETHOPRIM 160 MG TABLET
ORAL_TABLET | 0 refills | 0 days | Status: CP
Start: 2022-07-08 — End: ?

## 2022-08-06 MED ORDER — GABAPENTIN 100 MG CAPSULE
ORAL_CAPSULE | Freq: Three times a day (TID) | ORAL | 0 refills | 50 days | Status: CP
Start: 2022-08-06 — End: ?

## 2022-08-10 ENCOUNTER — Ambulatory Visit: Admit: 2022-08-10 | Discharge: 2022-08-10

## 2022-08-10 DIAGNOSIS — B20 Human immunodeficiency virus [HIV] disease: Principal | ICD-10-CM

## 2022-08-10 DIAGNOSIS — R519 Acute intractable headache, unspecified headache type: Principal | ICD-10-CM

## 2022-08-10 DIAGNOSIS — E119 Type 2 diabetes mellitus without complications: Principal | ICD-10-CM

## 2022-08-27 DIAGNOSIS — B582 Toxoplasma meningoencephalitis: Principal | ICD-10-CM

## 2022-08-27 MED ORDER — SULFAMETHOXAZOLE 800 MG-TRIMETHOPRIM 160 MG TABLET
ORAL_TABLET | ORAL | 2 refills | 0.00000 days | Status: CP
Start: 2022-08-27 — End: ?

## 2022-08-30 MED ORDER — SULFAMETHOXAZOLE 800 MG-TRIMETHOPRIM 160 MG TABLET
ORAL_TABLET | 2 refills | 0 days
Start: 2022-08-30 — End: ?

## 2022-08-31 MED ORDER — GABAPENTIN 100 MG CAPSULE
ORAL_CAPSULE | 0 refills | 0 days
Start: 2022-08-31 — End: ?

## 2022-09-05 MED ORDER — GABAPENTIN 100 MG CAPSULE
ORAL_CAPSULE | 0 refills | 0 days | Status: CP
Start: 2022-09-05 — End: ?

## 2022-10-05 ENCOUNTER — Ambulatory Visit: Admit: 2022-10-05 | Discharge: 2022-10-06

## 2022-10-05 DIAGNOSIS — K4090 Unilateral inguinal hernia, without obstruction or gangrene, not specified as recurrent: Principal | ICD-10-CM

## 2022-10-05 DIAGNOSIS — B582 Toxoplasma meningoencephalitis: Principal | ICD-10-CM

## 2022-10-05 DIAGNOSIS — B20 Human immunodeficiency virus [HIV] disease: Principal | ICD-10-CM

## 2022-10-05 DIAGNOSIS — E119 Type 2 diabetes mellitus without complications: Principal | ICD-10-CM

## 2022-10-05 MED ORDER — SULFAMETHOXAZOLE 800 MG-TRIMETHOPRIM 160 MG TABLET
ORAL_TABLET | 2 refills | 0 days | Status: CP
Start: 2022-10-05 — End: ?

## 2022-10-05 MED ORDER — GABAPENTIN 100 MG CAPSULE
ORAL_CAPSULE | 0 refills | 0 days | Status: CN
Start: 2022-10-05 — End: ?

## 2022-10-11 ENCOUNTER — Institutional Professional Consult (permissible substitution): Admit: 2022-10-11 | Discharge: 2022-10-12 | Attending: Pharmacist | Primary: Pharmacist

## 2022-10-11 DIAGNOSIS — E119 Type 2 diabetes mellitus without complications: Principal | ICD-10-CM

## 2022-10-24 ENCOUNTER — Ambulatory Visit: Admit: 2022-10-24 | Discharge: 2022-10-25

## 2022-11-02 DIAGNOSIS — Z794 Long term (current) use of insulin: Principal | ICD-10-CM

## 2022-11-02 DIAGNOSIS — E119 Type 2 diabetes mellitus without complications: Principal | ICD-10-CM

## 2022-11-02 MED ORDER — METFORMIN 850 MG TABLET
ORAL_TABLET | 5 refills | 0 days | Status: CP
Start: 2022-11-02 — End: ?

## 2022-11-10 ENCOUNTER — Ambulatory Visit: Admit: 2022-11-10 | Discharge: 2022-11-11

## 2022-11-10 DIAGNOSIS — Z794 Long term (current) use of insulin: Principal | ICD-10-CM

## 2022-11-10 DIAGNOSIS — I10 Essential (primary) hypertension: Principal | ICD-10-CM

## 2022-11-10 DIAGNOSIS — Z1211 Encounter for screening for malignant neoplasm of colon: Principal | ICD-10-CM

## 2022-11-10 DIAGNOSIS — N179 Acute kidney failure, unspecified: Principal | ICD-10-CM

## 2022-11-10 DIAGNOSIS — K4090 Unilateral inguinal hernia, without obstruction or gangrene, not specified as recurrent: Principal | ICD-10-CM

## 2022-11-10 DIAGNOSIS — B20 Human immunodeficiency virus [HIV] disease: Principal | ICD-10-CM

## 2022-11-10 DIAGNOSIS — E119 Type 2 diabetes mellitus without complications: Principal | ICD-10-CM

## 2022-12-16 DIAGNOSIS — R519 Acute intractable headache, unspecified headache type: Principal | ICD-10-CM

## 2023-01-04 ENCOUNTER — Ambulatory Visit: Admit: 2023-01-04 | Discharge: 2023-01-04

## 2023-01-04 DIAGNOSIS — K4090 Unilateral inguinal hernia, without obstruction or gangrene, not specified as recurrent: Principal | ICD-10-CM

## 2023-01-04 DIAGNOSIS — Z23 Encounter for immunization: Principal | ICD-10-CM

## 2023-01-04 DIAGNOSIS — G44019 Episodic cluster headache, not intractable: Principal | ICD-10-CM

## 2023-01-04 DIAGNOSIS — B20 Human immunodeficiency virus [HIV] disease: Principal | ICD-10-CM

## 2023-01-04 DIAGNOSIS — B582 Toxoplasma meningoencephalitis: Principal | ICD-10-CM

## 2023-01-04 DIAGNOSIS — Z794 Long term (current) use of insulin: Principal | ICD-10-CM

## 2023-01-04 DIAGNOSIS — E119 Type 2 diabetes mellitus without complications: Principal | ICD-10-CM

## 2023-01-04 MED ORDER — SULFAMETHOXAZOLE 800 MG-TRIMETHOPRIM 160 MG TABLET
ORAL_TABLET | 5 refills | 0 days | Status: CP
Start: 2023-01-04 — End: ?

## 2023-01-04 MED ORDER — GABAPENTIN 100 MG CAPSULE
ORAL_CAPSULE | Freq: Three times a day (TID) | ORAL | 1 refills | 30 days | Status: CP
Start: 2023-01-04 — End: ?

## 2023-01-13 ENCOUNTER — Ambulatory Visit: Admit: 2023-01-13

## 2023-01-29 ENCOUNTER — Ambulatory Visit: Admit: 2023-01-29 | Discharge: 2023-01-30

## 2023-02-01 ENCOUNTER — Ambulatory Visit: Admit: 2023-02-01 | Discharge: 2023-02-02

## 2023-02-01 DIAGNOSIS — Z1151 Encounter for screening for human papillomavirus (HPV): Principal | ICD-10-CM

## 2023-02-01 DIAGNOSIS — B582 Toxoplasma meningoencephalitis: Principal | ICD-10-CM

## 2023-02-01 DIAGNOSIS — I10 Essential (primary) hypertension: Principal | ICD-10-CM

## 2023-02-01 DIAGNOSIS — E119 Type 2 diabetes mellitus without complications: Principal | ICD-10-CM

## 2023-02-01 DIAGNOSIS — B20 Human immunodeficiency virus [HIV] disease: Principal | ICD-10-CM

## 2023-02-01 DIAGNOSIS — Z1212 Encounter for screening for malignant neoplasm of rectum: Principal | ICD-10-CM

## 2023-02-01 DIAGNOSIS — Z794 Long term (current) use of insulin: Principal | ICD-10-CM

## 2023-02-01 MED ORDER — ATORVASTATIN 10 MG TABLET
ORAL_TABLET | Freq: Every morning | ORAL | 11 refills | 30 days | Status: CP
Start: 2023-02-01 — End: 2024-01-27

## 2023-02-01 MED ORDER — LISINOPRIL 5 MG TABLET
ORAL_TABLET | Freq: Every morning | ORAL | 11 refills | 30 days | Status: CP
Start: 2023-02-01 — End: 2024-01-27

## 2023-02-01 MED ORDER — LEVETIRACETAM 500 MG TABLET
ORAL_TABLET | Freq: Two times a day (BID) | ORAL | 11 refills | 30 days | Status: CP
Start: 2023-02-01 — End: 2024-01-27

## 2023-02-01 MED ORDER — DORZOLAMIDE 22.3 MG-TIMOLOL 6.8 MG/ML EYE DROPS
Freq: Two times a day (BID) | OPHTHALMIC | 2 refills | 100 days | Status: CP
Start: 2023-02-01 — End: 2024-02-01

## 2023-02-01 MED ORDER — SYMTUZA 800 MG-150 MG-200 MG-10 MG TABLET
ORAL_TABLET | Freq: Every day | ORAL | 11 refills | 0 days | Status: CP
Start: 2023-02-01 — End: ?

## 2023-02-01 MED ORDER — METFORMIN 850 MG TABLET
ORAL_TABLET | Freq: Two times a day (BID) | ORAL | 5 refills | 30 days | Status: CP
Start: 2023-02-01 — End: ?

## 2023-02-01 MED ORDER — INSULIN GLARGINE (U-100) 100 UNIT/ML (3 ML) SUBCUTANEOUS PEN
Freq: Every evening | SUBCUTANEOUS | 3 refills | 68 days | Status: CP
Start: 2023-02-01 — End: 2024-09-23

## 2023-02-09 ENCOUNTER — Ambulatory Visit: Admit: 2023-02-09

## 2023-02-09 ENCOUNTER — Institutional Professional Consult (permissible substitution): Admit: 2023-02-09

## 2023-02-09 MED ORDER — DORZOLAMIDE 22.3 MG-TIMOLOL 6.8 MG/ML EYE DROPS
Freq: Two times a day (BID) | OPHTHALMIC | 2 refills | 100 days | Status: CP
Start: 2023-02-09 — End: 2024-02-09
  Filled 2023-02-10: qty 10, 100d supply, fill #0

## 2023-04-12 ENCOUNTER — Ambulatory Visit: Admit: 2023-04-12 | Discharge: 2023-04-12

## 2023-04-12 DIAGNOSIS — E119 Type 2 diabetes mellitus without complications: Principal | ICD-10-CM

## 2023-04-12 DIAGNOSIS — H5789 Other specified disorders of eye and adnexa: Principal | ICD-10-CM

## 2023-04-12 DIAGNOSIS — B582 Toxoplasma meningoencephalitis: Principal | ICD-10-CM

## 2023-04-12 DIAGNOSIS — Z794 Long term (current) use of insulin: Principal | ICD-10-CM

## 2023-04-12 DIAGNOSIS — B20 Human immunodeficiency virus [HIV] disease: Principal | ICD-10-CM

## 2023-04-12 DIAGNOSIS — K4090 Unilateral inguinal hernia, without obstruction or gangrene, not specified as recurrent: Principal | ICD-10-CM

## 2023-04-12 MED ORDER — CANAGLIFLOZIN 100 MG TABLET
ORAL_TABLET | Freq: Every day | ORAL | 2 refills | 0.00 days | Status: CP
Start: 2023-04-12 — End: ?

## 2023-04-12 MED ORDER — POLYMYXIN B SULFATE 10,000 UNIT-TRIMETHOPRIM 1 MG/ML EYE DROPS
Freq: Four times a day (QID) | OPHTHALMIC | 0 refills | 50.00 days | Status: CP
Start: 2023-04-12 — End: 2023-04-17

## 2023-04-18 ENCOUNTER — Ambulatory Visit: Admit: 2023-04-18 | Discharge: 2023-04-19

## 2023-05-17 MED ORDER — GABAPENTIN 100 MG CAPSULE
ORAL_CAPSULE | Freq: Three times a day (TID) | ORAL | 1 refills | 30.00 days | Status: CP
Start: 2023-05-17 — End: ?

## 2023-05-23 DIAGNOSIS — K219 Gastro-esophageal reflux disease without esophagitis: Principal | ICD-10-CM

## 2023-05-23 MED ORDER — OMEPRAZOLE 20 MG CAPSULE,DELAYED RELEASE
ORAL_CAPSULE | Freq: Every morning | ORAL | 3 refills | 90.00 days | Status: CP
Start: 2023-05-23 — End: 2024-05-22

## 2023-06-22 ENCOUNTER — Ambulatory Visit: Admit: 2023-06-22

## 2023-06-24 IMAGING — US US SCROTUM W/ DOPPLER COMPLETE
1 series · 13 of 25 positions shown · non-contrast
Comparison: CT Abdomen and Pelvis 0. 716 hours the same day
reported separately

CLINICAL DATA: 50-year-old male with massive scrotal swelling and
possible superimposed left inguinal hernia. Right proximal thigh
abscess. Diabetes and HIV.

EXAM:
SCROTAL ULTRASOUND
DOPPLER ULTRASOUND OF THE TESTICLES
TECHNIQUE: Complete ultrasound examination of the testicles, epididymis, and
other scrotal structures was performed. Color and spectral Doppler
ultrasound were also utilized to evaluate blood flow to the
testicles.

[Series 1: us scrotum w/doppler · 87 acquisitions, 13 frames shown]
[im 1/87]
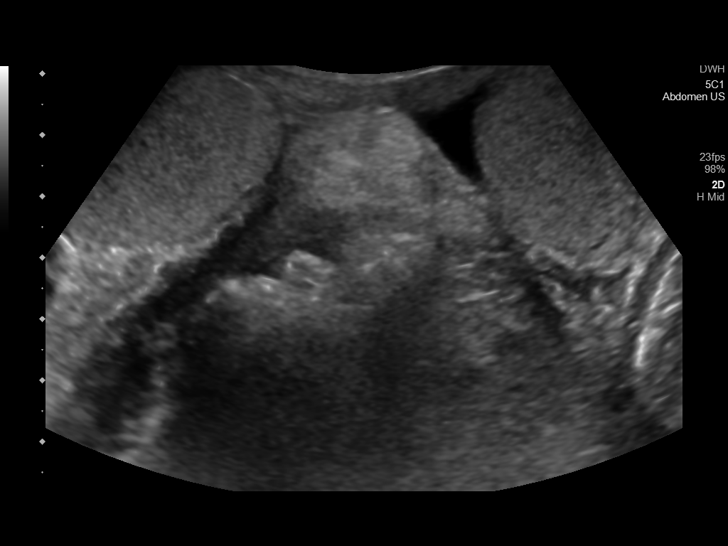
[im 8/87]
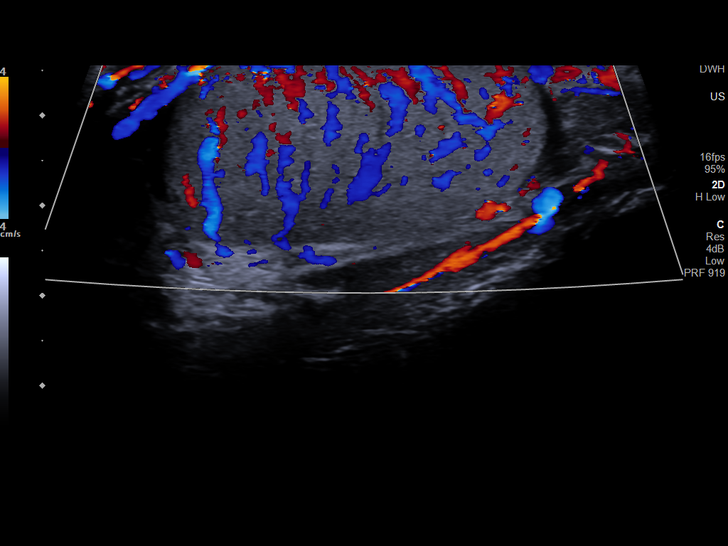
[im 15/87]
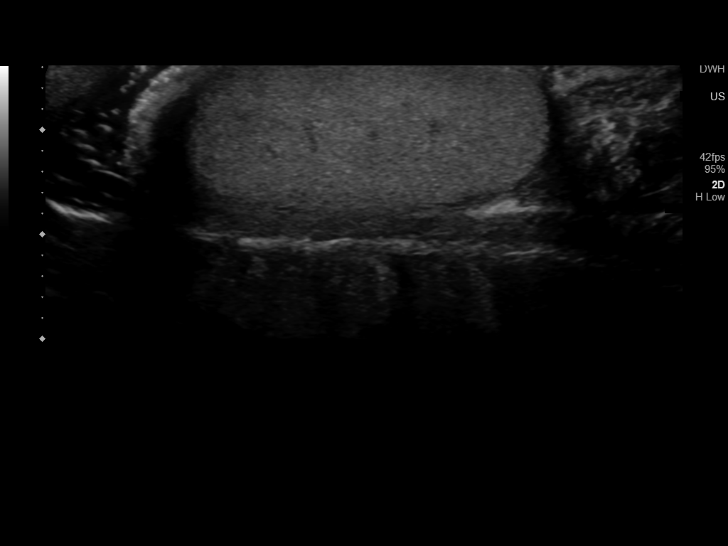
[im 22/87]
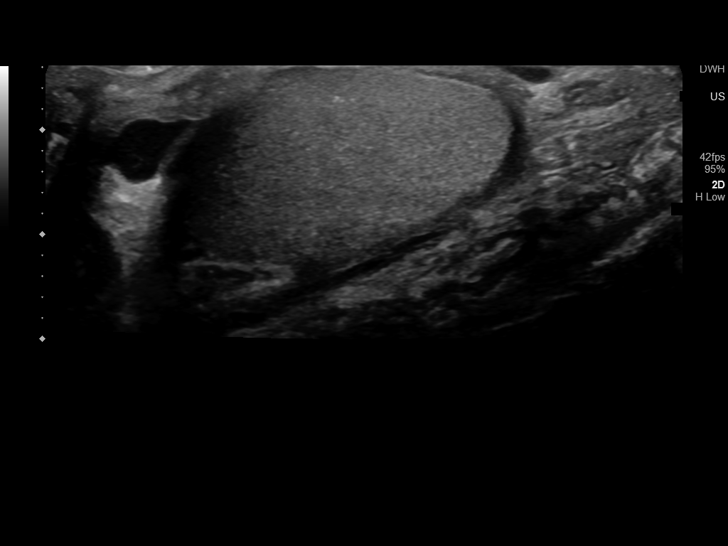
[im 29/87]
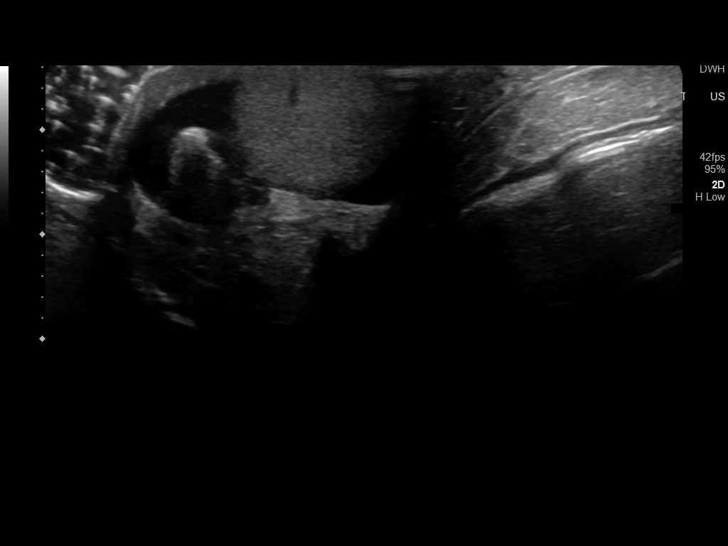
[im 36/87]
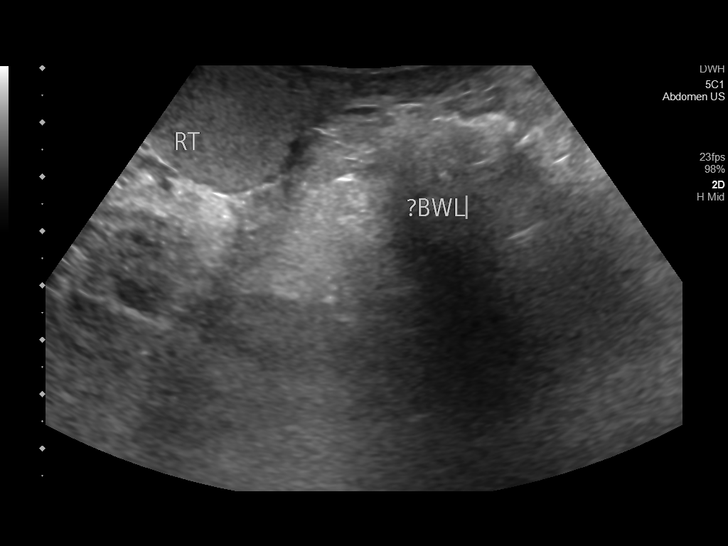
[im 44/87]
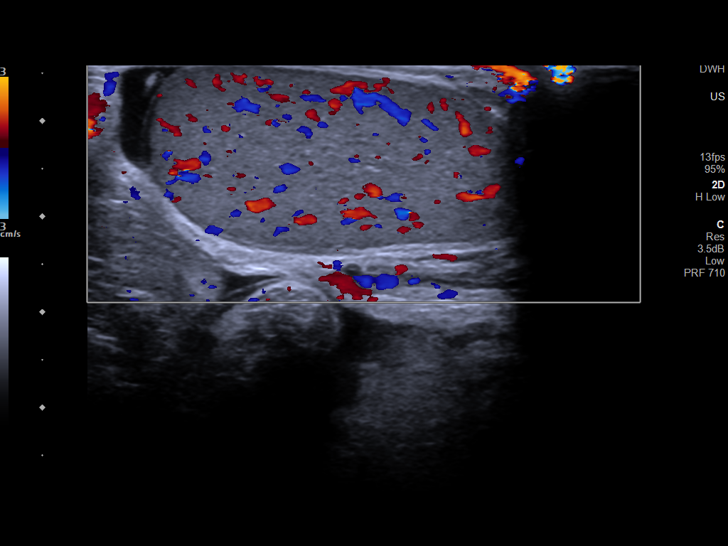
[im 51/87]
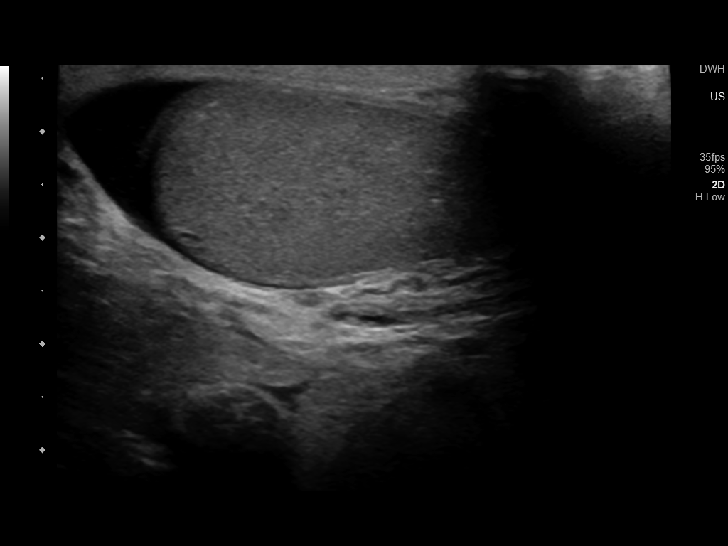
[im 58/87]
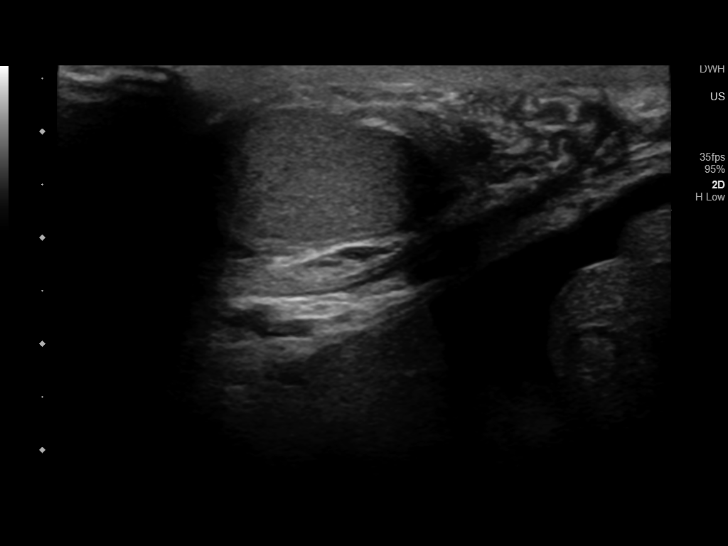
[im 65/87]
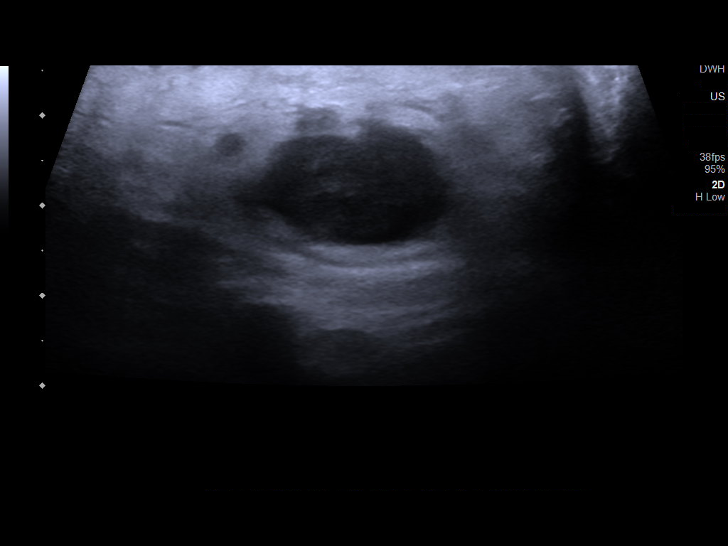
[im 72/87]
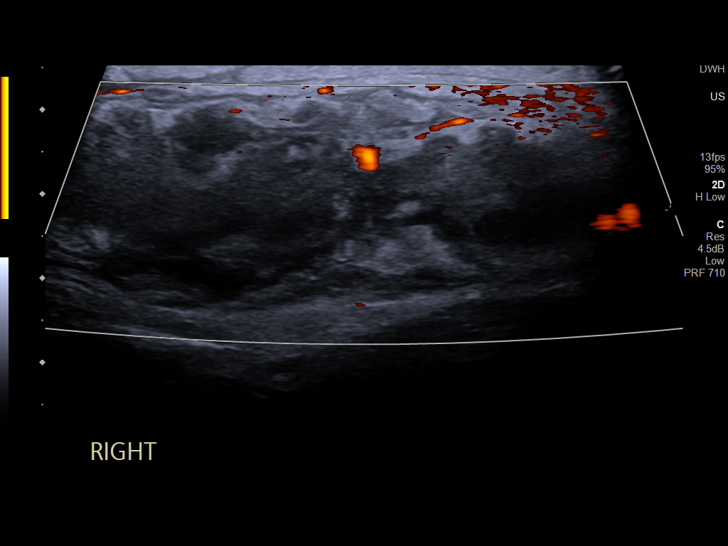
[im 79/87]
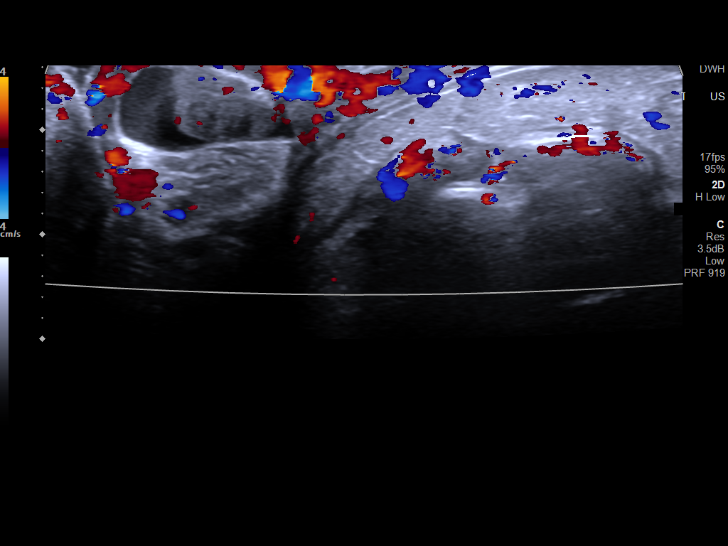
[im 87/87]
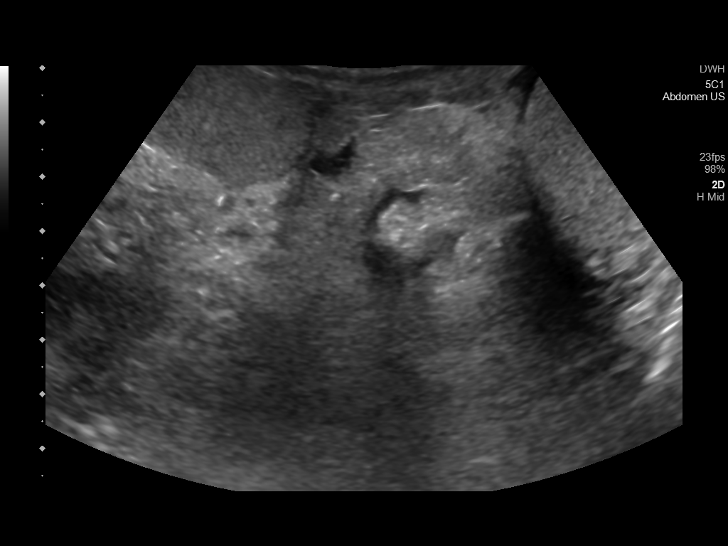

[13 of 25 positions shown; findings below may reference images not displayed]

FINDINGS: Right testicle

Measurements: 4.0 x 2.1 x 2.5 cm. No mass or microlithiasis
visualized.

Left testicle

Measurements: 3.8 x 2.0 x 3.1 cm. No mass or microlithiasis
visualized.

Right epididymis:  Normal in size and appearance.

Left epididymis:  Normal in size and appearance.

Hydrocele:  Small left-side simple appearing hydrocele.

Varicocele:  None visualized.

Pulsed Doppler interrogation of both testes demonstrates normal low
resistance arterial and venous waveforms bilaterally.

Other findings: Subcutaneous edema in the right inguinal region. An
1.1 cm short axis right inguinal node is visible (image 63), and
seems contiguous with an irregular roughly 7.2 x 2.5 x 4.3 cm
subcutaneous collection which is largely nonvascular (image 68) with
overlying subcutaneous edema (image 61).

Furthermore, there appears to be peristalsing bowel between the
testes in the scrotum (image 39).
IMPRESSION: 1. Negative for testicular mass or torsion.
2. Positive for an abnormal right inguinal lymph node which appears
associated with a roughly 7.2 cm phlegmon or abscess, See CT Abdomen
and Pelvis today reported separately.
3. Evidence of herniated bowel in the scrotum, see details also CT
Abdomen and Pelvis.

## 2023-06-24 IMAGING — DX DG CHEST 1V PORT
1 series · 1 of 1 positions shown · non-contrast
Comparison: Chest radiographs 04/12/2012 and earlier.

CLINICAL DATA: 50-year-old male with possible sepsis.

EXAM:
PORTABLE CHEST 1 VIEW

[chest ap]
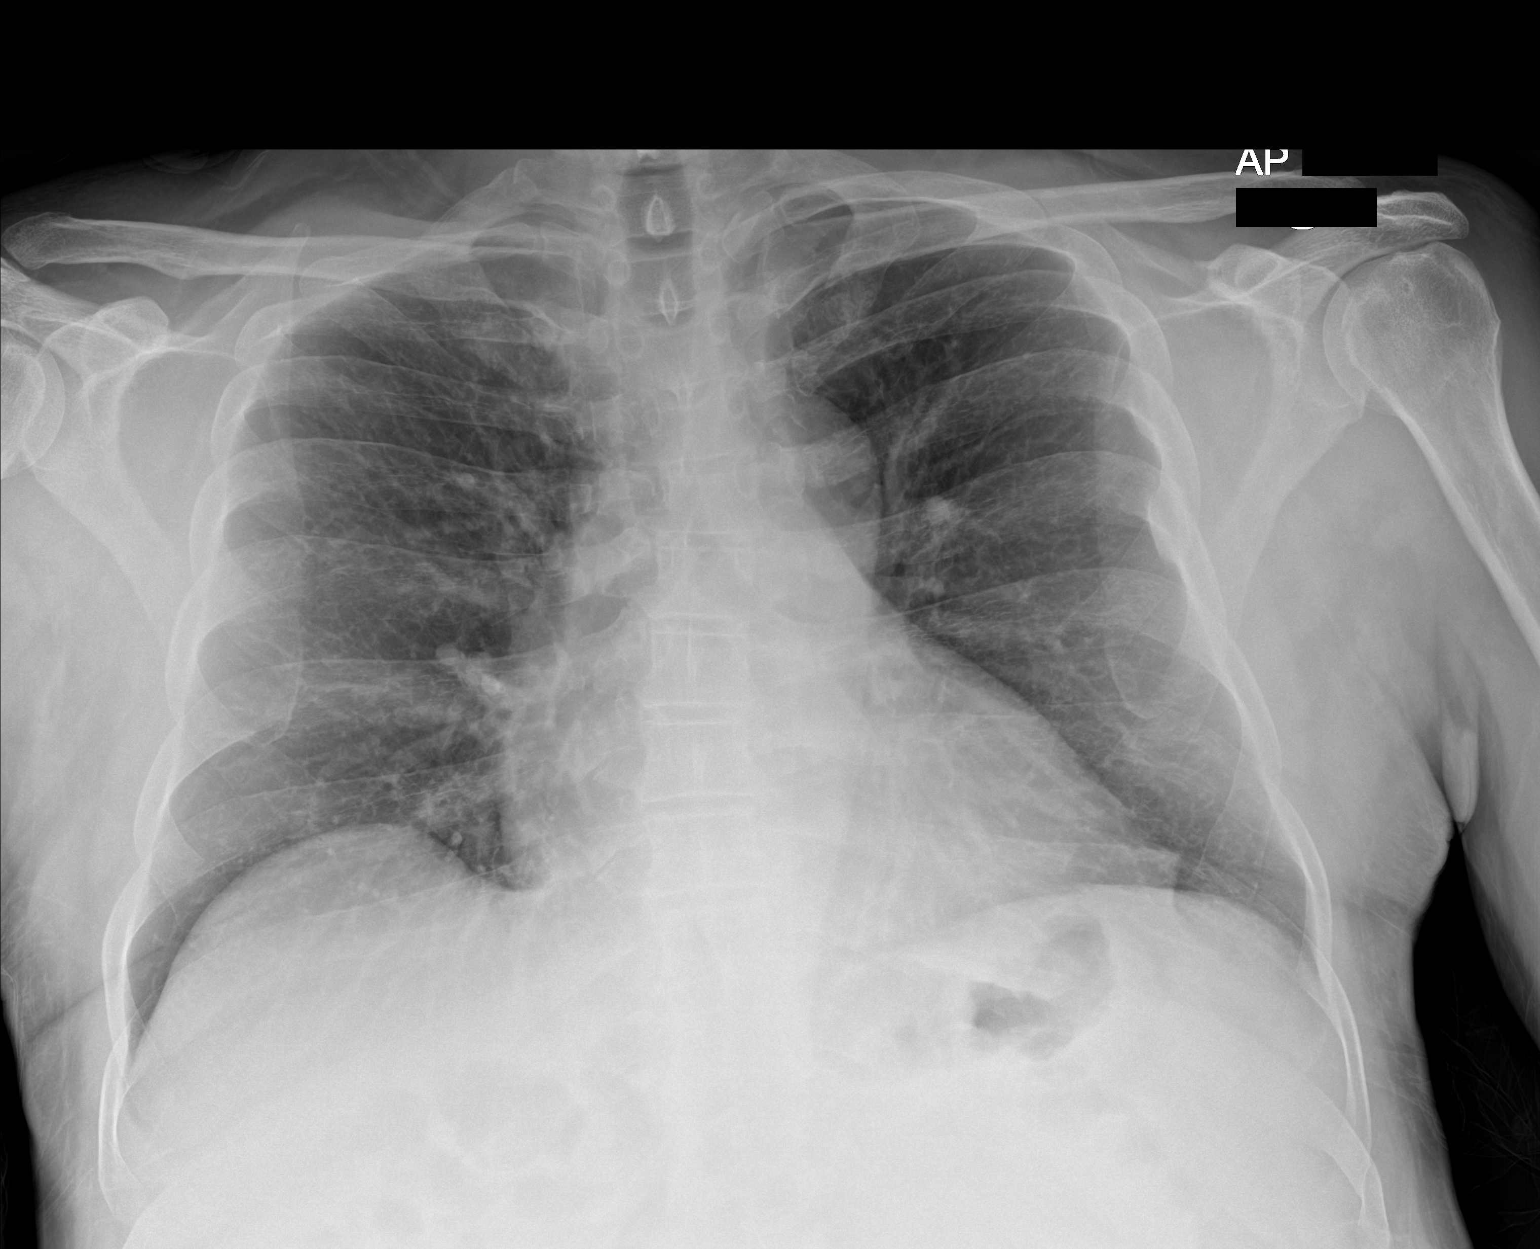

[1 of 1 positions shown; findings below may reference images not displayed]

FINDINGS: Portable AP upright view at 6020 hours. Lung volumes and mediastinal
contours are stable and within normal limits. Mild eventration of
the right hemidiaphragm, normal variant. Visualized tracheal air
column is within normal limits. Allowing for portable technique the
lungs are clear. No pneumothorax or pleural effusion. No acute
osseous abnormality identified. Negative visible bowel gas.
IMPRESSION: Negative portable chest.

## 2023-07-19 ENCOUNTER — Ambulatory Visit: Admit: 2023-07-19 | Discharge: 2023-07-20

## 2023-07-19 DIAGNOSIS — H5789 Other specified disorders of eye and adnexa: Principal | ICD-10-CM

## 2023-07-19 DIAGNOSIS — B582 Toxoplasma meningoencephalitis: Principal | ICD-10-CM

## 2023-07-19 DIAGNOSIS — B20 Human immunodeficiency virus [HIV] disease: Principal | ICD-10-CM

## 2023-07-19 DIAGNOSIS — E119 Type 2 diabetes mellitus without complications: Principal | ICD-10-CM

## 2023-07-19 DIAGNOSIS — Z23 Encounter for immunization: Principal | ICD-10-CM

## 2023-07-19 DIAGNOSIS — K4090 Unilateral inguinal hernia, without obstruction or gangrene, not specified as recurrent: Principal | ICD-10-CM

## 2023-07-19 DIAGNOSIS — Z794 Long term (current) use of insulin: Principal | ICD-10-CM

## 2023-07-19 DIAGNOSIS — K219 Gastro-esophageal reflux disease without esophagitis: Principal | ICD-10-CM

## 2023-07-19 MED ORDER — DORZOLAMIDE 22.3 MG-TIMOLOL 6.8 MG/ML EYE DROPS
Freq: Two times a day (BID) | OPHTHALMIC | 2 refills | 100.00000 days | Status: CP
Start: 2023-07-19 — End: 2024-07-18

## 2023-07-22 DIAGNOSIS — E119 Type 2 diabetes mellitus without complications: Principal | ICD-10-CM

## 2023-07-22 MED ORDER — CANAGLIFLOZIN 100 MG TABLET
ORAL_TABLET | Freq: Every day | ORAL | 2 refills | 0.00000 days
Start: 2023-07-22 — End: ?

## 2023-07-22 MED ORDER — INSULIN GLARGINE (U-100) 100 UNIT/ML (3 ML) SUBCUTANEOUS PEN
Freq: Every evening | SUBCUTANEOUS | 3 refills | 50.00000 days | Status: CP
Start: 2023-07-22 — End: 2025-03-13

## 2023-07-25 MED ORDER — CANAGLIFLOZIN 100 MG TABLET
ORAL_TABLET | Freq: Every day | ORAL | 2 refills | 0.00000 days | Status: CP
Start: 2023-07-25 — End: ?

## 2023-09-13 MED ORDER — GABAPENTIN 100 MG CAPSULE
ORAL_CAPSULE | Freq: Three times a day (TID) | ORAL | 1 refills | 30.00000 days | Status: CP
Start: 2023-09-13 — End: ?

## 2023-10-18 ENCOUNTER — Encounter: Admit: 2023-10-18 | Discharge: 2023-10-18

## 2023-10-18 DIAGNOSIS — E119 Type 2 diabetes mellitus without complications: Principal | ICD-10-CM

## 2023-10-18 DIAGNOSIS — Z794 Long term (current) use of insulin: Principal | ICD-10-CM

## 2023-10-18 DIAGNOSIS — B582 Toxoplasma meningoencephalitis: Principal | ICD-10-CM

## 2023-10-18 DIAGNOSIS — K4090 Unilateral inguinal hernia, without obstruction or gangrene, not specified as recurrent: Principal | ICD-10-CM

## 2023-10-18 DIAGNOSIS — B20 Human immunodeficiency virus [HIV] disease: Principal | ICD-10-CM

## 2023-10-18 MED ORDER — DORZOLAMIDE 22.3 MG-TIMOLOL 6.8 MG/ML EYE DROPS
Freq: Two times a day (BID) | OPHTHALMIC | 2 refills | 100.00000 days | Status: CP
Start: 2023-10-18 — End: 2024-10-17

## 2023-11-08 DIAGNOSIS — Z794 Long term (current) use of insulin: Principal | ICD-10-CM

## 2023-11-08 DIAGNOSIS — B20 Human immunodeficiency virus [HIV] disease: Principal | ICD-10-CM

## 2023-11-08 DIAGNOSIS — I10 Essential (primary) hypertension: Principal | ICD-10-CM

## 2023-11-08 DIAGNOSIS — E119 Type 2 diabetes mellitus without complications: Principal | ICD-10-CM

## 2023-11-08 MED ORDER — INSULIN GLARGINE (U-100) 100 UNIT/ML (3 ML) SUBCUTANEOUS PEN
Freq: Two times a day (BID) | SUBCUTANEOUS | 3 refills | 31.00000 days | Status: CP
Start: 2023-11-08 — End: 2025-06-30

## 2023-11-08 MED ORDER — SYMTUZA 800 MG-150 MG-200 MG-10 MG TABLET
ORAL_TABLET | Freq: Every day | ORAL | 5 refills | 0.00000 days | Status: CP
Start: 2023-11-08 — End: ?

## 2023-11-11 DIAGNOSIS — E119 Type 2 diabetes mellitus without complications: Principal | ICD-10-CM

## 2023-11-11 DIAGNOSIS — Z794 Long term (current) use of insulin: Principal | ICD-10-CM

## 2023-11-11 MED ORDER — METFORMIN 850 MG TABLET
ORAL_TABLET | Freq: Two times a day (BID) | ORAL | 5 refills | 30.00000 days
Start: 2023-11-11 — End: ?

## 2023-11-18 DIAGNOSIS — Z794 Long term (current) use of insulin: Principal | ICD-10-CM

## 2023-11-18 DIAGNOSIS — B582 Toxoplasma meningoencephalitis: Principal | ICD-10-CM

## 2023-11-18 DIAGNOSIS — E119 Type 2 diabetes mellitus without complications: Principal | ICD-10-CM

## 2023-11-18 MED ORDER — METFORMIN 850 MG TABLET
ORAL_TABLET | Freq: Two times a day (BID) | ORAL | 5 refills | 30.00000 days
Start: 2023-11-18 — End: ?

## 2023-11-18 MED ORDER — SULFAMETHOXAZOLE 800 MG-TRIMETHOPRIM 160 MG TABLET
ORAL_TABLET | ORAL | 5 refills | 0.00000 days | Status: CP
Start: 2023-11-18 — End: ?

## 2023-11-18 MED ORDER — TRUEPLUS PEN NEEDLE 32 GAUGE X 5/32" (4 MM)
Freq: Two times a day (BID) | SUBCUTANEOUS | 3 refills | 50.00000 days
Start: 2023-11-18 — End: ?

## 2023-11-19 MED ORDER — METFORMIN 850 MG TABLET
ORAL_TABLET | Freq: Two times a day (BID) | ORAL | 5 refills | 30.00000 days | Status: CP
Start: 2023-11-19 — End: ?

## 2023-11-19 MED ORDER — TRUEPLUS PEN NEEDLE 32 GAUGE X 5/32" (4 MM)
Freq: Two times a day (BID) | SUBCUTANEOUS | 3 refills | 50.00000 days | Status: CP
Start: 2023-11-19 — End: ?

## 2023-12-30 MED ORDER — GABAPENTIN 100 MG CAPSULE
ORAL_CAPSULE | Freq: Three times a day (TID) | ORAL | 1 refills | 30.00000 days
Start: 2023-12-30 — End: ?

## 2024-01-03 ENCOUNTER — Encounter: Admit: 2024-01-03 | Discharge: 2024-01-03

## 2024-01-03 DIAGNOSIS — R0789 Other chest pain: Principal | ICD-10-CM

## 2024-01-03 DIAGNOSIS — B20 Human immunodeficiency virus [HIV] disease: Principal | ICD-10-CM

## 2024-01-03 DIAGNOSIS — Z794 Long term (current) use of insulin: Principal | ICD-10-CM

## 2024-01-03 DIAGNOSIS — E119 Type 2 diabetes mellitus without complications: Principal | ICD-10-CM

## 2024-01-03 DIAGNOSIS — Z792 Long term (current) use of antibiotics: Principal | ICD-10-CM

## 2024-01-03 DIAGNOSIS — Z9189 Other specified personal risk factors, not elsewhere classified: Principal | ICD-10-CM

## 2024-01-03 DIAGNOSIS — R072 Precordial pain: Principal | ICD-10-CM

## 2024-01-03 DIAGNOSIS — K219 Gastro-esophageal reflux disease without esophagitis: Principal | ICD-10-CM

## 2024-01-03 DIAGNOSIS — B582 Toxoplasma meningoencephalitis: Principal | ICD-10-CM

## 2024-01-03 MED ORDER — OMEPRAZOLE 20 MG CAPSULE,DELAYED RELEASE
ORAL_CAPSULE | Freq: Every morning | ORAL | 3 refills | 90.00000 days | Status: CP
Start: 2024-01-03 — End: 2025-01-02

## 2024-01-03 MED ORDER — DORZOLAMIDE 22.3 MG-TIMOLOL 6.8 MG/ML EYE DROPS
Freq: Two times a day (BID) | OPHTHALMIC | 2 refills | 100.00000 days | Status: CP
Start: 2024-01-03 — End: 2025-01-02

## 2024-01-03 MED ORDER — GABAPENTIN 100 MG CAPSULE
ORAL_CAPSULE | Freq: Three times a day (TID) | ORAL | 1 refills | 30.00000 days | Status: CP
Start: 2024-01-03 — End: ?

## 2024-02-08 DIAGNOSIS — B582 Toxoplasma meningoencephalitis: Principal | ICD-10-CM

## 2024-02-08 DIAGNOSIS — E119 Type 2 diabetes mellitus without complications: Principal | ICD-10-CM

## 2024-02-08 DIAGNOSIS — I10 Essential (primary) hypertension: Principal | ICD-10-CM

## 2024-02-08 DIAGNOSIS — Z794 Long term (current) use of insulin: Principal | ICD-10-CM

## 2024-02-08 MED ORDER — ATORVASTATIN 10 MG TABLET
ORAL_TABLET | ORAL | 11 refills | 0.00000 days | Status: CP
Start: 2024-02-08 — End: ?

## 2024-02-08 MED ORDER — LISINOPRIL 5 MG TABLET
ORAL_TABLET | ORAL | 11 refills | 0.00000 days | Status: CP
Start: 2024-02-08 — End: ?

## 2024-02-08 MED ORDER — INVOKANA 100 MG TABLET
ORAL_TABLET | Freq: Every day | ORAL | 2 refills | 30.00000 days | Status: CP
Start: 2024-02-08 — End: ?

## 2024-02-08 MED ORDER — LEVETIRACETAM 500 MG TABLET
ORAL_TABLET | Freq: Two times a day (BID) | ORAL | 11 refills | 30.00000 days | Status: CP
Start: 2024-02-08 — End: ?

## 2024-04-13 DIAGNOSIS — E119 Type 2 diabetes mellitus without complications: Principal | ICD-10-CM

## 2024-04-13 MED ORDER — GABAPENTIN 100 MG CAPSULE
ORAL_CAPSULE | 0 refills | 0.00000 days
Start: 2024-04-13 — End: ?

## 2024-04-13 MED ORDER — INSULIN GLARGINE (U-100) 100 UNIT/ML (3 ML) SUBCUTANEOUS PEN
Freq: Two times a day (BID) | SUBCUTANEOUS | 8 refills | 31.00000 days | Status: CP
Start: 2024-04-13 — End: 2025-01-02

## 2024-04-16 MED ORDER — GABAPENTIN 100 MG CAPSULE
ORAL_CAPSULE | ORAL | 0 refills | 0.00000 days | Status: CP
Start: 2024-04-16 — End: ?
